# Patient Record
Sex: Female | Born: 1935 | Race: Black or African American | Hispanic: No | Smoking: Never smoker
Health system: Southern US, Community
[De-identification: ages and names within clinical notes are randomized; demographics above are authoritative.]

## PROBLEM LIST (undated history)

## (undated) DIAGNOSIS — K219 Gastro-esophageal reflux disease without esophagitis: Secondary | ICD-10-CM

## (undated) DIAGNOSIS — E039 Hypothyroidism, unspecified: Secondary | ICD-10-CM

## (undated) DIAGNOSIS — E785 Hyperlipidemia, unspecified: Secondary | ICD-10-CM

## (undated) DIAGNOSIS — I10 Essential (primary) hypertension: Secondary | ICD-10-CM

## (undated) HISTORY — PX: ABDOMINAL HYSTERECTOMY: SHX81

## (undated) HISTORY — PX: OTHER SURGICAL HISTORY: SHX169

---

## 2018-02-19 ENCOUNTER — Inpatient Hospital Stay (HOSPITAL_COMMUNITY)
Admission: EM | Admit: 2018-02-19 | Discharge: 2018-02-22 | DRG: 152 | Disposition: A | Discharge status: Home Health | Payer: Medicare Other | Attending: Family Medicine | Admitting: Family Medicine

## 2018-02-19 ENCOUNTER — Other Ambulatory Visit: Payer: Self-pay

## 2018-02-19 ENCOUNTER — Encounter (HOSPITAL_COMMUNITY): Payer: Self-pay | Admitting: Internal Medicine

## 2018-02-19 ENCOUNTER — Observation Stay (HOSPITAL_BASED_OUTPATIENT_CLINIC_OR_DEPARTMENT_OTHER): Payer: Medicare Other

## 2018-02-19 ENCOUNTER — Observation Stay (HOSPITAL_COMMUNITY): Payer: Medicare Other

## 2018-02-19 ENCOUNTER — Emergency Department (HOSPITAL_COMMUNITY): Payer: Medicare Other

## 2018-02-19 DIAGNOSIS — Z79890 Hormone replacement therapy (postmenopausal): Secondary | ICD-10-CM

## 2018-02-19 DIAGNOSIS — K219 Gastro-esophageal reflux disease without esophagitis: Secondary | ICD-10-CM | POA: Diagnosis present

## 2018-02-19 DIAGNOSIS — M170 Bilateral primary osteoarthritis of knee: Secondary | ICD-10-CM | POA: Diagnosis present

## 2018-02-19 DIAGNOSIS — I361 Nonrheumatic tricuspid (valve) insufficiency: Secondary | ICD-10-CM | POA: Diagnosis not present

## 2018-02-19 DIAGNOSIS — Z8249 Family history of ischemic heart disease and other diseases of the circulatory system: Secondary | ICD-10-CM

## 2018-02-19 DIAGNOSIS — Z85038 Personal history of other malignant neoplasm of large intestine: Secondary | ICD-10-CM

## 2018-02-19 DIAGNOSIS — R778 Other specified abnormalities of plasma proteins: Secondary | ICD-10-CM | POA: Diagnosis present

## 2018-02-19 DIAGNOSIS — Z885 Allergy status to narcotic agent status: Secondary | ICD-10-CM | POA: Diagnosis not present

## 2018-02-19 DIAGNOSIS — H9190 Unspecified hearing loss, unspecified ear: Secondary | ICD-10-CM | POA: Diagnosis present

## 2018-02-19 DIAGNOSIS — G5603 Carpal tunnel syndrome, bilateral upper limbs: Secondary | ICD-10-CM | POA: Diagnosis present

## 2018-02-19 DIAGNOSIS — R7989 Other specified abnormal findings of blood chemistry: Secondary | ICD-10-CM | POA: Diagnosis not present

## 2018-02-19 DIAGNOSIS — Z90710 Acquired absence of both cervix and uterus: Secondary | ICD-10-CM | POA: Diagnosis not present

## 2018-02-19 DIAGNOSIS — E785 Hyperlipidemia, unspecified: Secondary | ICD-10-CM | POA: Diagnosis present

## 2018-02-19 DIAGNOSIS — N184 Chronic kidney disease, stage 4 (severe): Secondary | ICD-10-CM | POA: Diagnosis present

## 2018-02-19 DIAGNOSIS — R0602 Shortness of breath: Secondary | ICD-10-CM | POA: Diagnosis present

## 2018-02-19 DIAGNOSIS — I10 Essential (primary) hypertension: Secondary | ICD-10-CM | POA: Diagnosis not present

## 2018-02-19 DIAGNOSIS — Z7951 Long term (current) use of inhaled steroids: Secondary | ICD-10-CM

## 2018-02-19 DIAGNOSIS — I2699 Other pulmonary embolism without acute cor pulmonale: Secondary | ICD-10-CM | POA: Diagnosis present

## 2018-02-19 DIAGNOSIS — E039 Hypothyroidism, unspecified: Secondary | ICD-10-CM | POA: Diagnosis present

## 2018-02-19 DIAGNOSIS — I248 Other forms of acute ischemic heart disease: Secondary | ICD-10-CM | POA: Diagnosis present

## 2018-02-19 DIAGNOSIS — Z79899 Other long term (current) drug therapy: Secondary | ICD-10-CM

## 2018-02-19 DIAGNOSIS — I5031 Acute diastolic (congestive) heart failure: Secondary | ICD-10-CM

## 2018-02-19 DIAGNOSIS — I130 Hypertensive heart and chronic kidney disease with heart failure and stage 1 through stage 4 chronic kidney disease, or unspecified chronic kidney disease: Secondary | ICD-10-CM | POA: Diagnosis present

## 2018-02-19 DIAGNOSIS — J111 Influenza due to unidentified influenza virus with other respiratory manifestations: Secondary | ICD-10-CM | POA: Diagnosis present

## 2018-02-19 DIAGNOSIS — J9601 Acute respiratory failure with hypoxia: Secondary | ICD-10-CM | POA: Diagnosis present

## 2018-02-19 DIAGNOSIS — I2693 Single subsegmental pulmonary embolism without acute cor pulmonale: Secondary | ICD-10-CM | POA: Diagnosis not present

## 2018-02-19 DIAGNOSIS — I509 Heart failure, unspecified: Secondary | ICD-10-CM

## 2018-02-19 DIAGNOSIS — I50811 Acute right heart failure: Secondary | ICD-10-CM | POA: Diagnosis present

## 2018-02-19 DIAGNOSIS — I272 Pulmonary hypertension, unspecified: Secondary | ICD-10-CM | POA: Diagnosis present

## 2018-02-19 DIAGNOSIS — N189 Chronic kidney disease, unspecified: Secondary | ICD-10-CM | POA: Diagnosis present

## 2018-02-19 HISTORY — DX: Essential (primary) hypertension: I10

## 2018-02-19 HISTORY — DX: Gastro-esophageal reflux disease without esophagitis: K21.9

## 2018-02-19 HISTORY — DX: Hypothyroidism, unspecified: E03.9

## 2018-02-19 HISTORY — DX: Hyperlipidemia, unspecified: E78.5

## 2018-02-19 LAB — CBC WITH DIFFERENTIAL/PLATELET
Abs Immature Granulocytes: 0.05 10*3/uL (ref 0.00–0.07)
Basophils Absolute: 0 10*3/uL (ref 0.0–0.1)
Basophils Relative: 0 %
Eosinophils Absolute: 0 10*3/uL (ref 0.0–0.5)
Eosinophils Relative: 0 %
HCT: 38.2 % (ref 36.0–46.0)
HEMOGLOBIN: 11.8 g/dL — AB (ref 12.0–15.0)
Immature Granulocytes: 1 %
Lymphocytes Relative: 9 %
Lymphs Abs: 0.7 10*3/uL (ref 0.7–4.0)
MCH: 28.2 pg (ref 26.0–34.0)
MCHC: 30.9 g/dL (ref 30.0–36.0)
MCV: 91.4 fL (ref 80.0–100.0)
MONOS PCT: 8 %
Monocytes Absolute: 0.6 10*3/uL (ref 0.1–1.0)
Neutro Abs: 6.4 10*3/uL (ref 1.7–7.7)
Neutrophils Relative %: 82 %
Platelets: 203 10*3/uL (ref 150–400)
RBC: 4.18 MIL/uL (ref 3.87–5.11)
RDW: 13.8 % (ref 11.5–15.5)
WBC: 7.8 10*3/uL (ref 4.0–10.5)
nRBC: 0 % (ref 0.0–0.2)

## 2018-02-19 LAB — ECHOCARDIOGRAM COMPLETE
Height: 66 in
Weight: 2816 oz

## 2018-02-19 LAB — PROTIME-INR
INR: 1.03
Prothrombin Time: 13.4 seconds (ref 11.4–15.2)

## 2018-02-19 LAB — LIPID PANEL
CHOL/HDL RATIO: 3.2 ratio
Cholesterol: 119 mg/dL (ref 0–200)
HDL: 37 mg/dL — ABNORMAL LOW (ref >40)
LDL Cholesterol: 59 mg/dL (ref 0–99)
Triglycerides: 113 mg/dL (ref <150)
VLDL: 23 mg/dL (ref 0–40)

## 2018-02-19 LAB — HEMOGLOBIN A1C
Hgb A1c MFr Bld: 5.8 % — ABNORMAL HIGH (ref 4.8–5.6)
Mean Plasma Glucose: 119.76 mg/dL

## 2018-02-19 LAB — COMPREHENSIVE METABOLIC PANEL
ALT: 38 U/L (ref 0–44)
AST: 54 U/L — AB (ref 15–41)
Albumin: 3.9 g/dL (ref 3.5–5.0)
Alkaline Phosphatase: 70 U/L (ref 38–126)
Anion gap: 10 (ref 5–15)
BUN: 56 mg/dL — ABNORMAL HIGH (ref 8–23)
CO2: 19 mmol/L — AB (ref 22–32)
Calcium: 9 mg/dL (ref 8.9–10.3)
Chloride: 109 mmol/L (ref 98–111)
Creatinine, Ser: 1.84 mg/dL — ABNORMAL HIGH (ref 0.44–1.00)
GFR calc Af Amer: 27 mL/min — ABNORMAL LOW (ref >60)
GFR calc non Af Amer: 24 mL/min — ABNORMAL LOW (ref >60)
Glucose, Bld: 140 mg/dL — ABNORMAL HIGH (ref 70–99)
Potassium: 4.5 mmol/L (ref 3.5–5.1)
Sodium: 138 mmol/L (ref 135–145)
Total Bilirubin: 0.8 mg/dL (ref 0.3–1.2)
Total Protein: 7.3 g/dL (ref 6.5–8.1)

## 2018-02-19 LAB — HEPARIN LEVEL (UNFRACTIONATED): Heparin Unfractionated: 0.56 IU/mL (ref 0.30–0.70)

## 2018-02-19 LAB — I-STAT TROPONIN, ED: Troponin i, poc: 0.83 ng/mL (ref 0.00–0.08)

## 2018-02-19 LAB — D-DIMER, QUANTITATIVE: D-Dimer, Quant: 14.49 ug/mL-FEU — ABNORMAL HIGH (ref 0.00–0.50)

## 2018-02-19 LAB — TROPONIN I
Troponin I: 0.75 ng/mL (ref <0.03)
Troponin I: 0.87 ng/mL (ref <0.03)

## 2018-02-19 LAB — APTT: aPTT: 23 seconds — ABNORMAL LOW (ref 24–36)

## 2018-02-19 LAB — BRAIN NATRIURETIC PEPTIDE: B Natriuretic Peptide: 922.3 pg/mL — ABNORMAL HIGH (ref 0.0–100.0)

## 2018-02-19 MED ORDER — IPRATROPIUM-ALBUTEROL 0.5-2.5 (3) MG/3ML IN SOLN
3.0000 mL | Freq: Three times a day (TID) | RESPIRATORY_TRACT | Status: DC
  Administered 2018-02-19 – 2018-02-20 (×4): 3 mL via RESPIRATORY_TRACT
  Filled 2018-02-19 (×4): qty 3

## 2018-02-19 MED ORDER — LEVOTHYROXINE SODIUM 50 MCG PO TABS
50.0000 ug | ORAL_TABLET | Freq: Every day | ORAL | Status: DC
  Administered 2018-02-19 – 2018-02-22 (×4): 50 ug via ORAL
  Filled 2018-02-19 (×4): qty 1

## 2018-02-19 MED ORDER — IPRATROPIUM-ALBUTEROL 0.5-2.5 (3) MG/3ML IN SOLN: 3.0000 mL | Freq: Four times a day (QID) | RESPIRATORY_TRACT | Status: DC

## 2018-02-19 MED ORDER — ALBUTEROL SULFATE (2.5 MG/3ML) 0.083% IN NEBU
5.0000 mg | INHALATION_SOLUTION | Freq: Once | RESPIRATORY_TRACT | Status: AC
  Administered 2018-02-19: 5 mg via RESPIRATORY_TRACT
  Filled 2018-02-19: qty 6

## 2018-02-19 MED ORDER — LOSARTAN POTASSIUM 50 MG PO TABS
100.0000 mg | ORAL_TABLET | Freq: Every day | ORAL | Status: DC
  Administered 2018-02-19 – 2018-02-20 (×2): 100 mg via ORAL
  Filled 2018-02-19 (×2): qty 2

## 2018-02-19 MED ORDER — NITROGLYCERIN 0.4 MG SL SUBL: 0.4000 mg | SUBLINGUAL_TABLET | SUBLINGUAL | Status: DC | PRN

## 2018-02-19 MED ORDER — ASPIRIN 81 MG PO CHEW: 324.0000 mg | CHEWABLE_TABLET | Freq: Once | ORAL | Status: DC

## 2018-02-19 MED ORDER — DM-GUAIFENESIN ER 30-600 MG PO TB12
1.0000 | ORAL_TABLET | Freq: Two times a day (BID) | ORAL | Status: DC | PRN
  Filled 2018-02-19: qty 1

## 2018-02-19 MED ORDER — MORPHINE SULFATE (PF) 2 MG/ML IV SOLN: 0.5000 mg | INTRAVENOUS | Status: DC | PRN

## 2018-02-19 MED ORDER — HEPARIN (PORCINE) 25000 UT/250ML-% IV SOLN
1050.0000 [IU]/h | INTRAVENOUS | Status: DC
  Administered 2018-02-19 – 2018-02-20 (×2): 1050 [IU]/h via INTRAVENOUS
  Filled 2018-02-19 (×2): qty 250

## 2018-02-19 MED ORDER — FAMOTIDINE 20 MG PO TABS
20.0000 mg | ORAL_TABLET | Freq: Every day | ORAL | Status: DC
  Administered 2018-02-19 – 2018-02-21 (×3): 20 mg via ORAL
  Filled 2018-02-19 (×3): qty 1

## 2018-02-19 MED ORDER — HEPARIN BOLUS VIA INFUSION
2000.0000 [IU] | Freq: Once | INTRAVENOUS | Status: AC
  Administered 2018-02-19: 2000 [IU] via INTRAVENOUS
  Filled 2018-02-19: qty 2000

## 2018-02-19 MED ORDER — ALBUTEROL SULFATE (2.5 MG/3ML) 0.083% IN NEBU
5.0000 mg | INHALATION_SOLUTION | RESPIRATORY_TRACT | Status: DC | PRN
  Administered 2018-02-19 – 2018-02-21 (×2): 5 mg via RESPIRATORY_TRACT
  Filled 2018-02-19 (×2): qty 6

## 2018-02-19 MED ORDER — ONDANSETRON HCL 4 MG/2ML IJ SOLN: 4.0000 mg | Freq: Four times a day (QID) | INTRAMUSCULAR | Status: DC | PRN

## 2018-02-19 MED ORDER — MECLIZINE HCL 25 MG PO TABS: 25.0000 mg | ORAL_TABLET | Freq: Every day | ORAL | Status: DC | PRN

## 2018-02-19 MED ORDER — TECHNETIUM TO 99M ALBUMIN AGGREGATED: 4.2000 | Freq: Once | INTRAVENOUS | Status: DC | PRN

## 2018-02-19 MED ORDER — FUROSEMIDE 10 MG/ML IJ SOLN: 40.0000 mg | Freq: Two times a day (BID) | INTRAMUSCULAR | Status: DC

## 2018-02-19 MED ORDER — ASPIRIN EC 81 MG PO TBEC
81.0000 mg | DELAYED_RELEASE_TABLET | Freq: Every day | ORAL | Status: DC
  Administered 2018-02-19 – 2018-02-22 (×4): 81 mg via ORAL
  Filled 2018-02-19 (×4): qty 1

## 2018-02-19 MED ORDER — PRAVASTATIN SODIUM 40 MG PO TABS
80.0000 mg | ORAL_TABLET | Freq: Every day | ORAL | Status: DC
  Administered 2018-02-19 – 2018-02-21 (×3): 80 mg via ORAL
  Filled 2018-02-19 (×2): qty 2

## 2018-02-19 MED ORDER — SODIUM CHLORIDE 0.9% FLUSH: 3.0000 mL | INTRAVENOUS | Status: DC | PRN

## 2018-02-19 MED ORDER — FUROSEMIDE 10 MG/ML IJ SOLN
20.0000 mg | Freq: Once | INTRAMUSCULAR | Status: AC
  Administered 2018-02-19: 20 mg via INTRAVENOUS
  Filled 2018-02-19: qty 4

## 2018-02-19 MED ORDER — ZOLPIDEM TARTRATE 5 MG PO TABS: 5.0000 mg | ORAL_TABLET | Freq: Every evening | ORAL | Status: DC | PRN

## 2018-02-19 MED ORDER — SODIUM CHLORIDE 0.9% FLUSH
3.0000 mL | Freq: Two times a day (BID) | INTRAVENOUS | Status: DC
  Administered 2018-02-19 – 2018-02-21 (×3): 3 mL via INTRAVENOUS

## 2018-02-19 MED ORDER — HYDRALAZINE HCL 20 MG/ML IJ SOLN: 5.0000 mg | INTRAMUSCULAR | Status: DC | PRN

## 2018-02-19 MED ORDER — TECHNETIUM TC 99M DIETHYLENETRIAME-PENTAACETIC ACID: 31.0000 | Freq: Once | INTRAVENOUS | Status: DC | PRN

## 2018-02-19 MED ORDER — ACETAMINOPHEN 325 MG PO TABS
650.0000 mg | ORAL_TABLET | ORAL | Status: DC | PRN
  Administered 2018-02-22: 650 mg via ORAL
  Filled 2018-02-19: qty 2

## 2018-02-19 MED ORDER — HEPARIN SODIUM (PORCINE) 5000 UNIT/ML IJ SOLN: 5000.0000 [IU] | Freq: Three times a day (TID) | INTRAMUSCULAR | Status: DC

## 2018-02-19 MED ORDER — SODIUM CHLORIDE 0.9 % IV SOLN: 250.0000 mL | INTRAVENOUS | Status: DC | PRN

## 2018-02-19 MED ORDER — IPRATROPIUM-ALBUTEROL 0.5-2.5 (3) MG/3ML IN SOLN
3.0000 mL | Freq: Once | RESPIRATORY_TRACT | Status: AC
  Administered 2018-02-19: 3 mL via RESPIRATORY_TRACT
  Filled 2018-02-19: qty 3

## 2018-02-19 NOTE — ED Triage Notes (Signed)
With exertion pt O2 saturation drops into 80's is in 90's at rest. Near syncopal at home with exertion alert to normal on arrival to ED. Recent Hx + flu virus.

## 2018-02-19 NOTE — ED Notes (Signed)
ECG given to Dr. Blaine Hamper for review

## 2018-02-19 NOTE — ED Notes (Signed)
Bed: QG92 Expected date:  Expected time:  Means of arrival:  Comments: 85 yr old flu positive, low O2 sats

## 2018-02-19 NOTE — Progress Notes (Signed)
ANTICOAGULATION CONSULT NOTE - Initial Consult  Pharmacy Consult for IV heparin Indication: R/o PE  Allergies  Allergen Reactions  . Codeine Nausea And Vomiting    Patient Measurements:   Heparin Dosing Weight: 65 kg  Vital Signs: Temp: 98.7 F (37.1 C) (01/24 0048) BP: 115/67 (01/24 0048) Pulse Rate: 75 (01/24 0048)  Labs: Recent Labs    02/19/18 0120 02/19/18 0320  HGB 11.8*  --   HCT 38.2  --   PLT 203  --   CREATININE 1.84*  --   TROPONINI  --  0.75*    CrCl cannot be calculated (Unknown ideal weight.).   Medical History: Past Medical History:  Diagnosis Date  . GERD (gastroesophageal reflux disease)   . HLD (hyperlipidemia)   . HTN (hypertension)   . Hypothyroidism     Medications:  Scheduled:  . aspirin EC  81 mg Oral Daily  . famotidine  20 mg Oral QHS  . furosemide  20 mg Intravenous Once  . ipratropium-albuterol  3 mL Nebulization TID  . levothyroxine  50 mcg Oral QAC breakfast  . losartan  100 mg Oral Daily  . pravastatin  80 mg Oral q1800  . sodium chloride flush  3 mL Intravenous Q12H   Infusions:  . sodium chloride      Assessment: 89 yoF with SOB, found to have elevated d-dimer. IV heparin for r/o PE.  Baseline labs: H/H=11.8/38.2 Plts=203 aPtt = 23 INR=1.03  Goal of Therapy:  Heparin level 0.3-0.7 units/ml Monitor platelets by anticoagulation protocol: Yes   Plan:  Baseline aptt, PT/INR, Ht and Wt STAT Heparin 2000 unit IV bolus x1 Start drip at 1050 units/hr Daily CBC/HL Check 1st HL in 8 hours   Lawana Pai R 02/19/2018,4:51 AM

## 2018-02-19 NOTE — Progress Notes (Signed)
Bilateral lower extremities venous duplex exam completed. Please see preliminary notes on CV PROC under chart review. Lyonel Morejon H Ronon Ferger(RDMS RVT) 02/19/18 11:38 AM

## 2018-02-19 NOTE — ED Notes (Signed)
ED TO INPATIENT HANDOFF REPORT  Name/Age/Gender Morgan Harrell 85 y.o. female  Code Status    Code Status Orders  (From admission, onward)         Start     Ordered   02/19/18 0328  Full code  Continuous     02/19/18 0329        Code Status History    This patient has a current code status but no historical code status.      Home/SNF/Other Home  Chief Complaint Shortness of Breath  Level of Care/Admitting Diagnosis ED Disposition    ED Disposition Condition Comment   Admit  Hospital Area: [facility] [[id number]]  Level of Care: Telemetry [5]  Admit to tele based on following criteria: Other see comments  Comments: CHF  Diagnosis: Acute CHF (congestive heart failure) [facility]) [[id number]]  Admitting Physician: Ivor Costa [[id number]]  Attending Physician: Ivor Costa [[id number]]  PT Class (Do Not Modify): Observation [104]  PT Acc Code (Do Not Modify): Observation [10022]       Medical History Past Medical History:  Diagnosis Date  . GERD (gastroesophageal reflux disease)   . HLD (hyperlipidemia)   . HTN (hypertension)   . Hypothyroidism     Allergies Allergies  Allergen Reactions  . Codeine Nausea And Vomiting    IV Location/Drains/Wounds Patient Lines/Drains/Airways Status   Active Line/Drains/Airways    Name:   Placement date:   Placement time:   Site:   Days:   Peripheral IV 02/19/18 Right Antecubital   02/19/18    1142    Antecubital   less than 1          Labs/Imaging Results for orders placed or performed during the hospital encounter of 02/19/18 (from the past 48 hour(s))  Comprehensive metabolic panel     Status: Abnormal   Collection Time: 02/19/18  1:20 AM  Result Value Ref Range   Sodium 138 135 - 145 mmol/L   Potassium 4.5 3.5 - 5.1 mmol/L   Chloride 109 98 - 111 mmol/L   CO2 19 (L) 22 - 32 mmol/L   Glucose, Bld 140 (H) 70 - 99 mg/dL   BUN 56 (H) 8 - 23 mg/dL   Creatinine, Ser 1.84 (H) 0.44 - 1.00 mg/dL   Calcium 9.0 8.9 -  10.3 mg/dL   Total Protein 7.3 6.5 - 8.1 g/dL   Albumin 3.9 3.5 - 5.0 g/dL   AST 54 (H) 15 - 41 U/L   ALT 38 0 - 44 U/L   Alkaline Phosphatase 70 38 - 126 U/L   Total Bilirubin 0.8 0.3 - 1.2 mg/dL   GFR calc non Af Amer 24 (L) >60 mL/min   GFR calc Af Amer 27 (L) >60 mL/min   Anion gap 10 5 - 15    Comment: Performed at [facility], [place] [address] [place], [place] [address]  CBC with Differential     Status: Abnormal   Collection Time: 02/19/18  1:20 AM  Result Value Ref Range   WBC 7.8 4.0 - 10.5 K/uL   RBC 4.18 3.87 - 5.11 MIL/uL   Hemoglobin 11.8 (L) 12.0 - 15.0 g/dL   HCT 38.2 36.0 - 46.0 %   MCV 91.4 80.0 - 100.0 fL   MCH 28.2 26.0 - 34.0 pg   MCHC 30.9 30.0 - 36.0 g/dL   RDW 13.8 11.5 - 15.5 %   Platelets 203 150 - 400 K/uL   nRBC 0.0 0.0 - 0.2 %  Neutrophils Relative % 82 %   Neutro Abs 6.4 1.7 - 7.7 K/uL   Lymphocytes Relative 9 %   Lymphs Abs 0.7 0.7 - 4.0 K/uL   Monocytes Relative 8 %   Monocytes Absolute 0.6 0.1 - 1.0 K/uL   Eosinophils Relative 0 %   Eosinophils Absolute 0.0 0.0 - 0.5 K/uL   Basophils Relative 0 %   Basophils Absolute 0.0 0.0 - 0.1 K/uL   Immature Granulocytes 1 %   Abs Immature Granulocytes 0.05 0.00 - 0.07 K/uL    Comment: Performed at [facility], [place] [address], [place], [place] [address]  Hemoglobin A1c     Status: Abnormal   Collection Time: 02/19/18  1:20 AM  Result Value Ref Range   Hgb A1c MFr Bld 5.8 (H) 4.8 - 5.6 %    Comment: (NOTE) Pre diabetes:          5.7%-6.4% Diabetes:              >6.4% Glycemic control for   <7.0% adults with diabetes    Mean Plasma Glucose 119.76 mg/dL    Comment: Performed at [facility] [address], [place], [place] [address]  I-Stat Troponin, ED (not at [facility])     Status: Abnormal   Collection Time: 02/19/18  1:38 AM  Result Value Ref Range   Troponin i, poc 0.83 (HH) 0.00 - 0.08 ng/mL   Comment NOTIFIED PHYSICIAN    Comment 3             Comment: Due to the release kinetics of cTnI, a negative result within the first hours of the onset of symptoms does not rule out myocardial infarction with certainty. If myocardial infarction is still suspected, repeat the test at appropriate intervals.   D-dimer, quantitative (not at [facility])     Status: Abnormal   Collection Time: 02/19/18  3:20 AM  Result Value Ref Range   D-Dimer, Quant 14.49 (H) 0.00 - 0.50 ug/mL-FEU    Comment: (NOTE) At the manufacturer cut-off of 0.50 ug/mL FEU, this assay has been documented to exclude PE with a sensitivity and negative predictive value of 97 to 99%.  At this time, this assay has not been approved by the FDA to exclude DVT/VTE. Results should be correlated with clinical presentation. Performed at [facility], [place] [address], [place], [place] [address]   Brain natriuretic peptide     Status: Abnormal   Collection Time: 02/19/18  3:20 AM  Result Value Ref Range   B Natriuretic Peptide 922.3 (H) 0.0 - 100.0 pg/mL    Comment: Performed at [facility], [place] [address], [place], [place] [address]  Lipid panel     Status: Abnormal   Collection Time: 02/19/18  3:20 AM  Result Value Ref Range   Cholesterol 119 0 - 200 mg/dL   Triglycerides 113 <150 mg/dL   HDL 37 (L) >40 mg/dL   Total CHOL/HDL Ratio 3.2 RATIO   VLDL 23 0 - 40 mg/dL   LDL Cholesterol 59 0 - 99 mg/dL    Comment:        Total Cholesterol/HDL:CHD Risk Coronary Heart Disease Risk Table                     Men   Women  1/2 Average Risk   3.4   3.3  Average Risk       5.0   4.4  2 X Average Risk  9.6   7.1  3 X Average Risk  23.4   11.0        Use the calculated Patient Ratio above and the CHD Risk Table to determine the patient's CHD Risk.        ATP III CLASSIFICATION (LDL):  <100     mg/dL   Optimal  100-129  mg/dL   Near or Above                    Optimal  130-159  mg/dL   Borderline  160-189  mg/dL   High  >190     mg/dL   Very  High Performed at [facility] [address], [place], [place] [address]   Troponin I -     Status: Abnormal   Collection Time: 02/19/18  3:20 AM  Result Value Ref Range   Troponin I 0.75 (HH) <0.03 ng/mL    Comment: CRITICAL RESULT CALLED TO, READ BACK BY AND VERIFIED WITH: J OXENDINE,RN 02/19/18 0424 RHOLMES Performed at [facility], [place] [address], [place], [place] [address]   APTT     Status: Abnormal   Collection Time: 02/19/18  5:53 AM  Result Value Ref Range   aPTT 23 (L) 24 - 36 seconds    Comment: Performed at [facility], [place] [address] [place], [place] [address]  Protime-INR     Status: None   Collection Time: 02/19/18  5:53 AM  Result Value Ref Range   Prothrombin Time 13.4 11.4 - 15.2 seconds   INR 1.03     Comment: Performed at [facility], [place] [address] [place], [place] [address]   Dg Chest 2 View  Result Date: 02/19/2018 CLINICAL DATA:  85 year old female with shortness of breath. EXAM: CHEST - 2 VIEW COMPARISON:  None. FINDINGS: There is cardiomegaly with probable mild vascular congestion. No edema. No focal consolidation or pneumothorax. There is slight flattening of the diaphragms which may represent areas of scarring. Trace pleural effusions are less likely but not excluded. Clinical correlation is recommended. There is atherosclerotic calcification of the aortic arch. No acute osseous pathology. IMPRESSION: Cardiomegaly with mild vascular congestion. No edema or focal consolidation. Electronically Signed   By: Anner Crete M.D.   On: 02/19/2018 01:57   Nm Pulmonary Perf And Vent  Addendum Date: 02/19/2018   ADDENDUM REPORT: 02/19/2018 10:44 ADDENDUM: These results were called by telephone at the time of interpretation on 02/19/2018 at 10:44 am to Dr. Kyung Bacca, covering hospitalist , who verbally acknowledged these results. Electronically Signed   By: Lowella Grip III M.D.   On:  02/19/2018 10:44   Result Date: 02/19/2018 CLINICAL DATA:  Shortness of Breath EXAM: NUCLEAR MEDICINE VENTILATION - PERFUSION LUNG SCAN VIEWS: Anterior, posterior, left lateral, right lateral, RPO, LPO, RAO, LAO-ventilation and perfusion RADIOPHARMACEUTICALS:  31.0 mCi of Tc-24m DTPA aerosol inhalation and 4.2 mCi Tc38m MAA IV COMPARISON:  Chest radiograph February 19, 2018 FINDINGS: Ventilation: Radiotracer uptake is homogeneous and symmetric bilaterally. No appreciable ventilation defects. Perfusion: There is absence of perfusion in the apical segment of the left upper lobe. There is also photopenia in the superior segment of the left lower lobe. There is also a perfusion defect in the anterior segment of the right upper lobe. IMPRESSION: There are apparent segmental perfusion defects that associated ventilation lesions. This study is felt to constitute a high probability of pulmonary embolus based on PIOPED II criteria. Electronically Signed: By: Gwyndolyn Saxon  Jasmine December III M.D. On: 02/19/2018 10:30   Vas [place] Lower Extremity Venous (dvt)  Result Date: 02/19/2018  Lower Venous Study Indications: Positive D-dimer.  Limitations: Imagery deficit; patient discomfort. Comparison Study: No comparison study available. Performing Technologist: Rudell Cobb  Examination Guidelines: A complete evaluation includes B-mode imaging, spectral Doppler, color Doppler, and power Doppler as needed of all accessible portions of each vessel. Bilateral testing is considered an integral part of a complete examination. Limited examinations for reoccurring indications may be performed as noted.  Right Venous Findings: +---------+---------------+---------+-----------+----------+-------------------+          CompressibilityPhasicitySpontaneityPropertiesSummary             +---------+---------------+---------+-----------+----------+-------------------+ CFV      Full           Yes      Yes                                       +---------+---------------+---------+-----------+----------+-------------------+ SFJ      Full                                                             +---------+---------------+---------+-----------+----------+-------------------+ FV Prox  Full                                                             +---------+---------------+---------+-----------+----------+-------------------+ FV Mid   Full                                                             +---------+---------------+---------+-----------+----------+-------------------+ FV Distal               Yes      Yes                  difficult to                                                              perform compression                                                       due to patient has                                                        severe pain         +---------+---------------+---------+-----------+----------+-------------------+  PFV      Full                                                             +---------+---------------+---------+-----------+----------+-------------------+ POP      Full           Yes      Yes                                      +---------+---------------+---------+-----------+----------+-------------------+ PTV      Full                                                             +---------+---------------+---------+-----------+----------+-------------------+ PERO                                                  Not visualized      +---------+---------------+---------+-----------+----------+-------------------+  Left Venous Findings: +---------+---------------+---------+-----------+----------+--------------+          CompressibilityPhasicitySpontaneityPropertiesSummary        +---------+---------------+---------+-----------+----------+--------------+ CFV      Full           Yes      Yes                                  +---------+---------------+---------+-----------+----------+--------------+ SFJ      Full                                                        +---------+---------------+---------+-----------+----------+--------------+ FV Prox  Full                                                        +---------+---------------+---------+-----------+----------+--------------+ FV Mid   Full                                                        +---------+---------------+---------+-----------+----------+--------------+ FV DistalFull                                                        +---------+---------------+---------+-----------+----------+--------------+ PFV      Full                                                        +---------+---------------+---------+-----------+----------+--------------+  POP      Full           Yes      Yes                                 +---------+---------------+---------+-----------+----------+--------------+ PTV      Full                                                        +---------+---------------+---------+-----------+----------+--------------+ PERO                                                  Not visualized +---------+---------------+---------+-----------+----------+--------------+    Summary: Right: There is no evidence of deep vein thrombosis in the lower extremity. However, portions of this examination were limited- see technologist comments above. No cystic structure found in the popliteal fossa. Left: There is no evidence of deep vein thrombosis in the lower extremity. However, portions of this examination were limited- see technologist comments above. No cystic structure found in the popliteal fossa.  *See table(s) above for measurements and observations. Electronically signed by Servando Snare MD on 02/19/2018 at 12:04:40 PM.    Final     Pending Labs Unresulted Labs (From admission, onward)    Start     Ordered    02/20/18 0932  Basic metabolic panel  Daily,   R     02/19/18 0329   02/20/18 0500  CBC  Daily,   R     02/19/18 0647   02/19/18 1630  Heparin level (unfractionated)  Once-Timed,   R     02/19/18 0910   02/19/18 0320  Troponin I - Now Then Q6H  Now then every 6 hours,   R     02/19/18 0319          Vitals/Pain Today's Vitals   02/19/18 1400 02/19/18 1430 02/19/18 1500 02/19/18 1530  BP: (!) 145/63 (!) 142/74 (!) 141/64 (!) 141/65  Pulse: 70 72 73 71  Resp: 16 14 16 17   Temp:      SpO2: 100% 100% 100% 100%  Weight:      Height:        Isolation Precautions No active isolations  Medications Medications  albuterol (PROVENTIL) (2.5 MG/3ML) 0.083% nebulizer solution 5 mg (has no administration in time range)  dextromethorphan-guaiFENesin (MUCINEX DM) 30-600 MG per 12 hr tablet 1 tablet (has no administration in time range)  losartan (COZAAR) tablet 100 mg (100 mg Oral Given 02/19/18 1017)  pravastatin (PRAVACHOL) tablet 80 mg (has no administration in time range)  levothyroxine (SYNTHROID, LEVOTHROID) tablet 50 mcg (50 mcg Oral Given 02/19/18 0817)  meclizine (ANTIVERT) tablet 25 mg (has no administration in time range)  famotidine (PEPCID) tablet 20 mg (has no administration in time range)  nitroGLYCERIN (NITROSTAT) SL tablet 0.4 mg (has no administration in time range)  morphine 2 MG/ML injection 0.5 mg (has no administration in time range)  aspirin EC tablet 81 mg (81 mg Oral Given 02/19/18 0529)  ipratropium-albuterol (DUONEB) 0.5-2.5 (3) MG/3ML nebulizer solution 3 mL (3 mLs Nebulization Given 02/19/18 1338)  sodium chloride flush (NS) 0.9 %  injection 3 mL (0 mLs Intravenous Hold 02/19/18 0904)  sodium chloride flush (NS) 0.9 % injection 3 mL (has no administration in time range)  0.9 %  sodium chloride infusion (has no administration in time range)  acetaminophen (TYLENOL) tablet 650 mg (has no administration in time range)  ondansetron (ZOFRAN) injection 4 mg (has no  administration in time range)  zolpidem (AMBIEN) tablet 5 mg (has no administration in time range)  hydrALAZINE (APRESOLINE) injection 5 mg (has no administration in time range)  heparin ADULT infusion 100 units/mL (25000 units/243mL sodium chloride 0.45%) (1,050 Units/hr Intravenous Rate/Dose Verify 02/19/18 1521)  technetium albumin aggregated (MAA) injection solution 4.2 millicurie (has no administration in time range)  technetium TC 52M diethylenetriame-pentaacetic acid (DTPA) injection 31 millicurie (has no administration in time range)  albuterol (PROVENTIL) (2.5 MG/3ML) 0.083% nebulizer solution 5 mg (5 mg Nebulization Given 02/19/18 0123)  ipratropium-albuterol (DUONEB) 0.5-2.5 (3) MG/3ML nebulizer solution 3 mL (3 mLs Nebulization Given 02/19/18 0241)  furosemide (LASIX) injection 20 mg (20 mg Intravenous Given 02/19/18 0528)  heparin bolus via infusion 2,000 Units (2,000 Units Intravenous Bolus from Bag 02/19/18 0822)    Mobility walks with device

## 2018-02-19 NOTE — Progress Notes (Signed)
ANTICOAGULATION CONSULT NOTE -   Pharmacy Consult for IV heparin Indication: R/o PE  Allergies  Allergen Reactions  . Codeine Nausea And Vomiting    Patient Measurements: Height: 5\' 6"  (167.6 cm) Weight: 168 lb 14.4 oz (76.6 kg) IBW/kg (Calculated) : 59.3 Heparin Dosing Weight: 65 kg  Vital Signs: Temp: 98 F (36.7 C) (01/24 1629) Temp Source: Oral (01/24 1629) BP: 173/78 (01/24 1629) Pulse Rate: 69 (01/24 1629)  Labs: Recent Labs    02/19/18 0120 02/19/18 0320 02/19/18 0553 02/19/18 1725  HGB 11.8*  --   --   --   HCT 38.2  --   --   --   PLT 203  --   --   --   APTT  --   --  23*  --   LABPROT  --   --  13.4  --   INR  --   --  1.03  --   HEPARINUNFRC  --   --   --  0.56  CREATININE 1.84*  --   --   --   TROPONINI  --  0.75*  --   --     Estimated Creatinine Clearance: 20.8 mL/min (A) (by C-G formula based on SCr of 1.84 mg/dL (H)).   Medical History: Past Medical History:  Diagnosis Date  . GERD (gastroesophageal reflux disease)   . HLD (hyperlipidemia)   . HTN (hypertension)   . Hypothyroidism     Medications:  Scheduled:  . aspirin EC  81 mg Oral Daily  . famotidine  20 mg Oral QHS  . ipratropium-albuterol  3 mL Nebulization TID  . levothyroxine  50 mcg Oral QAC breakfast  . losartan  100 mg Oral Daily  . pravastatin  80 mg Oral q1800  . sodium chloride flush  3 mL Intravenous Q12H   Infusions:  . sodium chloride    . heparin 1,050 Units/hr (02/19/18 1521)    Assessment: 34 yoF with SOB, found to have elevated d-dimer. IV heparin for r/o PE.  Baseline labs: H/H=11.8/38.2 Plts=203 aPtt = 23 INR=1.03  02/19/2018 1st Heparin level=0.56, therapeutic No bleeding noted per RN  Goal of Therapy:  Heparin level 0.3-0.7 units/ml Monitor platelets by anticoagulation protocol: Yes   Plan:  Continue heparin drip at 1050 units/hr Daily CBC/HL Check  HL in 8 hours   Dolly Rias RPh 02/19/2018, 6:25 PM Pager [phone]

## 2018-02-19 NOTE — Consult Note (Addendum)
Cardiology Consultation:   Patient ID: Morgan Harrell MRN: [id number]; DOB: February 24, 1924  Admit date: 02/19/2018 Date of Consult: 02/19/2018  Primary Care Provider: System, Pcp Not In Primary Cardiologist: Mertie Moores, MD  - New Primary Electrophysiologist:  None    Patient Profile:   Morgan Harrell is a 84 y.o. female with a hx of CKD, hypertension, hyperlipidemia, remote colon cancer 1996, arthritis, bilateral carpal tunnel, GERD and osteoarthritis of the knees who is being seen today for the evaluation of shortness of breath and elevated troponin at the request of Dr. Kyung Bacca.  History of Present Illness:   Morgan Harrell is a 85 year old female who lives at home and is independent of most ADLs.  She recently moved near from [place] to live with her daughter.  She has had frequent car trips between [place] and [place].  She presented to the [facility] long ED with complaints of shortness of breath for about the last 2 weeks with worsening over the past several days.  The patient has a cough with clear mucus production and she had subjective fever and chills a few days ago.  She was seen in urgent care with a positive flu test.  She was given a prescription for Xofluza.   In the ED the patient was noted to have bilateral lower leg edema, no calf pain.  Oxygen saturations were in the 80s on room air.  The patient denied a history of CHF but is taking Lasix at home.  BNP is 922.3.  Troponin 0.83 (POC), 0.75.  D-dimer is 14.49 and VQ scan is pending.  Serum creatinine is 1.84.  Upon my examination the patient is a pleasant, conversant lady who is hard of hearing.  She lives in [place] however since her husband's death in 2023-01-02 she has been coming up to [place] to stay with her daughter intermittently.  She says that she has had a few months of worsening shortness of breath, significantly worse over the last 2 weeks until she was having dyspnea on minimal exertion.  She was  previously seen in [place] by her PCP and apparently had a lung test and was told that she has some type of pulmonary issue for which she was prescribed Symbicort.  She denies any chest pain, pressure, tightness.  She does feel exhausted with activity.  She has had no orthopnea, PND or swelling.  She has bilateral knee arthritis and uses a walker.  She has bilateral carpal tunnel syndrome and has significant pain in both of her hands.  2 days ago she went to [facility] urgent care for her symptoms and reportedly tested positive for the flu.  She was also given an albuterol inhaler.  She has never smoked.  She denies any prior cardiac history.  She takes Lasix reportedly for blood pressure control.  She denies any prior edema issues.   Past Medical History:  Diagnosis Date  . GERD (gastroesophageal reflux disease)   . HLD (hyperlipidemia)   . HTN (hypertension)   . Hypothyroidism     Past Surgical History:  Procedure Laterality Date  . ABDOMINAL HYSTERECTOMY    . Colon cancer     1996     Home Medications:  Prior to Admission medications   Medication Sig Start Date End Date Taking? Authorizing Provider  albuterol (PROVENTIL HFA;VENTOLIN HFA) 108 (90 Base) MCG/ACT inhaler Inhale 2 puffs into the lungs every 4 (four) hours as needed for wheezing or shortness of breath.   Yes [provider]  budesonide-formoterol (SYMBICORT) 160-4.5 MCG/ACT inhaler Inhale 2 puffs into the lungs 2 (two) times daily.   Yes [provider]  fluticasone (FLONASE) 50 MCG/ACT nasal spray Place 2 sprays into both nostrils daily.   Yes [provider]  furosemide (LASIX) 40 MG tablet Take 40 mg by mouth daily.   Yes [provider]  levothyroxine (SYNTHROID, LEVOTHROID) 50 MCG tablet Take 50 mcg by mouth daily before breakfast.   Yes [provider]  loratadine (CLARITIN) 10 MG tablet Take 10 mg by mouth daily.   Yes [provider]  losartan (COZAAR) 100 MG  tablet Take 100 mg by mouth daily.   Yes [provider]  meclizine (ANTIVERT) 25 MG tablet Take 25 mg by mouth daily as needed for dizziness (vertigo).   Yes [provider]  Pitavastatin Calcium (LIVALO) 4 MG TABS Take 4 mg by mouth daily.   Yes [provider]  ranitidine (ZANTAC) 150 MG tablet Take 150 mg by mouth daily.   Yes [provider]    Inpatient Medications: Scheduled Meds: . aspirin EC  81 mg Oral Daily  . famotidine  20 mg Oral QHS  . ipratropium-albuterol  3 mL Nebulization TID  . levothyroxine  50 mcg Oral QAC breakfast  . losartan  100 mg Oral Daily  . pravastatin  80 mg Oral q1800  . sodium chloride flush  3 mL Intravenous Q12H   Continuous Infusions: . sodium chloride    . heparin 1,050 Units/hr (02/19/18 0820)   PRN Meds: sodium chloride, acetaminophen, albuterol, dextromethorphan-guaiFENesin, hydrALAZINE, meclizine, morphine injection, nitroGLYCERIN, ondansetron (ZOFRAN) IV, sodium chloride flush, technetium albumin aggregated, technetium TC 41M diethylenetriame-pentaacetic acid, zolpidem  Allergies:    Allergies  Allergen Reactions  . Codeine Nausea And Vomiting    Social History:   Social History   Socioeconomic History  . Marital status: Widowed    Spouse name: Not on file  . Number of children: Not on file  . Years of education: Not on file  . Highest education level: Not on file  Occupational History  . Not on file  Social Needs  . Financial resource strain: Not on file  . Food insecurity:    Worry: Not on file    Inability: Not on file  . Transportation needs:    Medical: Not on file    Non-medical: Not on file  Tobacco Use  . Smoking status: Never Smoker  . Smokeless tobacco: Never Used  Substance and Sexual Activity  . Alcohol use: Not Currently    Frequency: Never  . Drug use: Never  . Sexual activity: Not on file  Lifestyle  . Physical activity:    Days per week: Not on file    Minutes per  session: Not on file  . Stress: Not on file  Relationships  . Social connections:    Talks on phone: Not on file    Gets together: Not on file    Attends religious service: Not on file    Active member of club or organization: Not on file    Attends meetings of clubs or organizations: Not on file    Relationship status: Not on file  . Intimate partner violence:    Fear of current or ex partner: Not on file    Emotionally abused: Not on file    Physically abused: Not on file    Forced sexual activity: Not on file  Other Topics Concern  . Not on file  Social History Narrative  . Not on file    Family History:    Family History  Problem Relation Age of Onset  . Hypertension Sister   . CAD Sister        ?MI in her 48's  . Hypertension Brother   . Heart disease Brother        possibly CHF  . Hypertension Mother   . Stroke Mother   . Alzheimer's disease Mother   . Hypertension Father      ROS:  Please see the history of present illness.   All other ROS reviewed and negative.     Physical Exam/Data:   Vitals:   02/19/18 0800 02/19/18 1017 02/19/18 1021 02/19/18 1100  BP: (!) 141/81 (!) 142/67 (!) 142/67 131/79  Pulse: 68  78 70  Resp: 16  14 16   Temp:      SpO2: 99%  100% 100%  Weight:      Height:       No intake or output data in the 24 hours ending 02/19/18 1122 Last 3 Weights 02/19/2018  Weight (lbs) 176 lb  Weight (kg) 79.833 kg     Body mass index is 28.41 kg/m.  General:  Well nourished, well developed, in no acute distress HEENT: normal Lymph: no adenopathy Neck: trace JVD Endocrine:  No thryomegaly Vascular: No carotid bruits; pedal pulses 2+ bilaterally  Cardiac:  normal S1, S2; RRR; no murmur  Lungs: Short inspiratory and expiratory wheezes Abd: soft, nontender, no hepatomegaly  Ext: no edema Musculoskeletal:  No deformities, BUE and BLE strength normal and equal Skin: warm and dry  Neuro:  CNs 2-12 intact, no focal abnormalities  noted Psych:  Normal affect   EKG:  The EKG was personally reviewed and demonstrates:  Sinus rhythm, Multiple premature complexes, vent & supraven, Borderline short PR interval, Incomplete RBBB, Fuhs T wave inversions, no prior for comparison Telemetry:  Telemetry was personally reviewed and demonstrates: Sinus rhythm with PACs in the 70s  Relevant CV Studies:  Echo pending  Laboratory Data:  Chemistry Recent Labs  Lab 02/19/18 0120  NA 138  K 4.5  CL 109  CO2 19*  GLUCOSE 140*  BUN 56*  CREATININE 1.84*  CALCIUM 9.0  GFRNONAA 24*  GFRAA 27*  ANIONGAP 10    Recent Labs  Lab 02/19/18 0120  PROT 7.3  ALBUMIN 3.9  AST 54*  ALT 38  ALKPHOS 70  BILITOT 0.8   Hematology Recent Labs  Lab 02/19/18 0120  WBC 7.8  RBC 4.18  HGB 11.8*  HCT 38.2  MCV 91.4  MCH 28.2  MCHC 30.9  RDW 13.8  PLT 203   Cardiac Enzymes Recent Labs  Lab 02/19/18 0320  TROPONINI 0.75*    Recent Labs  Lab 02/19/18 0138  TROPIPOC 0.83*    BNP Recent Labs  Lab 02/19/18 0320  BNP 922.3*    DDimer  Recent Labs  Lab 02/19/18 0320  DDIMER 14.49*    Radiology/Studies:  Dg Chest 2 View  Result Date: 02/19/2018 CLINICAL DATA:  85 year old female with shortness of breath. EXAM: CHEST - 2 VIEW COMPARISON:  None. FINDINGS: There is cardiomegaly with probable mild vascular congestion. No edema. No focal consolidation or pneumothorax. There is slight flattening of the diaphragms which may represent areas of scarring. Trace pleural effusions are less likely but not excluded. Clinical correlation is recommended. There is atherosclerotic calcification of the aortic arch. No acute osseous pathology. IMPRESSION: Cardiomegaly with mild vascular congestion. No edema  or focal consolidation. Electronically Signed   By: Anner Crete M.D.   On: 02/19/2018 01:57   Nm Pulmonary Perf And Vent  Addendum Date: 02/19/2018   ADDENDUM REPORT: 02/19/2018 10:44 ADDENDUM: These results were called by  telephone at the time of interpretation on 02/19/2018 at 10:44 am to Dr. Kyung Bacca, covering hospitalist , who verbally acknowledged these results. Electronically Signed   By: Lowella Grip III M.D.   On: 02/19/2018 10:44   Result Date: 02/19/2018 CLINICAL DATA:  Shortness of Breath EXAM: NUCLEAR MEDICINE VENTILATION - PERFUSION LUNG SCAN VIEWS: Anterior, posterior, left lateral, right lateral, RPO, LPO, RAO, LAO-ventilation and perfusion RADIOPHARMACEUTICALS:  31.0 mCi of Tc-83m DTPA aerosol inhalation and 4.2 mCi Tc61m MAA IV COMPARISON:  Chest radiograph February 19, 2018 FINDINGS: Ventilation: Radiotracer uptake is homogeneous and symmetric bilaterally. No appreciable ventilation defects. Perfusion: There is absence of perfusion in the apical segment of the left upper lobe. There is also photopenia in the superior segment of the left lower lobe. There is also a perfusion defect in the anterior segment of the right upper lobe. IMPRESSION: There are apparent segmental perfusion defects that associated ventilation lesions. This study is felt to constitute a high probability of pulmonary embolus based on PIOPED II criteria. Electronically Signed: By: Lowella Grip III M.D. On: 02/19/2018 10:30    Assessment and Plan:   1. Shortness of breath/congestive heart failure -Patient with several months of worsening shortness of breath, over the last 2 weeks DOE with minimal exertion.  No chest pain pressure or discomfort.  Was diagnosed in [place] with a pulmonary issue and started on Pulmicort.  She has never smoked. -Patient recently treated for positive flu, per urgent care on Wednesday.  He has wheezes in her lungs. -Chest x-ray shows cardiomegaly with mild vascular congestion, no edema or focal consolidation. -D-dimer is elevated at 14.49.  VQ scan has been ordered, awaiting results. Lower extremity Dopplers are pending. -BNP is 922.3.   -Echo is pending -She has been given Lasix 20 mg IV x1 this  morning.  -Strict intake and output and daily weights. -Monitor response to diuresis.  Follow-up on studies to rule out pulmonary embolism.  Has heparin drip infusing.  2.  Elevated troponin -Troponin 0 0.83 (POC), 0.75.  -Continue to trend troponin, and elevation would indicate myocardial ischemia.  Continued flat pattern would be consistent with demand ischemia in the setting of recent illness and CHF. -Evaluate echocardiogram for wall motion abnormalities and LV function  3.  Acute respiratory failure with hypoxia -Recently treated for positive flu.  Patient has inspiratory and expiratory wheezes.  -Continuing treatment with nebulizers and supportive care.  4.  Hypertension -Patient is on losartan 100 mg daily, lasix 40 mg daily. -Blood pressures have been normal to mildly elevated.  Continue to monitor.  5.  CKD -Patient with history of CKD, but baseline creatinine is not available. -Serum creatinine is 1.84  6.  Hyperlipidemia -On pitavastatin.  LDL is 59.  Continue current therapy   For questions or updates, please contact [place] Please consult [email or web address] for contact info under     Signed, Daune Perch, NP  02/19/2018 11:22 AM   Attending Note:   The patient was seen and examined.  Agree with assessment and plan as noted above.  Changes made to the above note as needed.  Patient seen and independently examined with Pecolia Ades, NP .   We discussed all aspects of the encounter. I agree with the assessment  and plan as stated above.  1.   Dyspnea:    Ms. Lybrand has several reasons to be short of breath.  She was found to have an acute pulmonary embolus.   This Pulmonary  Embolus is likely the cause of her troponin elevation as well as her EKG abnormalities. Echocardiogram has been ordered and is still yet to be done. At this point it is unclear if she has any real cardiac issues.  2.  Pulmonary embolus: Is 85 years old.  VQ scan is high prob of a left  upper lobe pulmonary embolus .   Been traveling but only short car trips. She  denies any leg injury. D-dimer is 14  She has a creatinine of 1.84 and is 85 yo. Would recommend a pharmacy consult to help dose her DOAC.  Echo is pending   3.   HTN:   Continue meds  4.  Elevated troponin:   Likely due to her PE.   I would not suggest further ischemic work up in this 85 yo patient.    I have spent a total of 40 minutes with patient reviewing hospital  notes , telemetry, EKGs, labs and examining patient as well as establishing an assessment and plan that was discussed with the patient. > 50% of time was spent in direct patient care.    Thayer Headings, Brooke Bonito., MD, [facility] 02/19/2018, 12:40 PM [address],  [place] Pager [phone]

## 2018-02-19 NOTE — H&P (Signed)
History and Physical    Violette Morneault KYH:[id number] DOB: 1924-02-08 DOA: 02/19/2018  Referring MD/NP/PA:   PCP: System, Pcp Not In   Patient coming from:  The patient is coming from home.  At baseline, pt is independent for most of ADL.        Chief Complaint: Shortness of breath  HPI: Morgan Harrell is a 85 y.o. female with medical history significant of CKD, hypertension, hyperlipidemia, remote colon cancer 1996 (surgery), knee arthritis, GERD, who presents with shortness of breath.  Patient states that she has been having shortness of breath for about 2 weeks, which has worsened in the past several days.  She has cough with clear mucus production.  Patient had subjective fever and chills few days ago.  She was seen in urgent care yesterday, and had positive flu test (not sure which type of flu).  Patient was given prescription of 2 pills of Xolfuza.  Patient denies any chest pain, fever or chills currently.  She has nausea, but no vomiting, diarrhea or abdominal pain.  No symptoms of UTI or unilateral weakness.  Patient states that she recently moved from [place] to live with her daughter here. She has had frequent traveling by car between [place] and [place]. No calf pain. She has bilateral lower leg edema.  Patient had oxygen desaturation to 80s on room air.  Patient denies history of CHF, but that she is taking Lasix at home. Pt states that she did not have any fall or head injury. No rectal bleeding or dark stool.  ED Course: pt was found to have WBC 7.8, troponin 0 0.83, creatinine 1.84, BUN 56, temperature normal, no tachycardia, no tachypnea.  Chest x-ray showed cardiomegaly and vascular congestion.  Pending BNP. D-dimer 14.4.  Patient is placed on telemetry bed for observation.  Review of Systems:   General: no fevers, chills, no body weight gain, has fatigue HEENT: no blurry vision, hearing changes or sore throat Respiratory: Has dyspnea, coughing, no wheezing CV: no  chest pain, no palpitations GI: Has nausea, no vomiting, abdominal pain, diarrhea, constipation GU: no dysuria, burning on urination, increased urinary frequency, hematuria  Ext: Has leg edema Neuro: no unilateral weakness, numbness, or tingling, no vision change or hearing loss Skin: no rash, no skin tear. MSK: No muscle spasm, no deformity, no limitation of range of movement in spin Heme: No easy bruising.  Travel history: Has frequent recent long distant travel.  Allergy:  Allergies  Allergen Reactions  . Codeine Nausea And Vomiting    Past Medical History:  Diagnosis Date  . GERD (gastroesophageal reflux disease)   . HLD (hyperlipidemia)   . HTN (hypertension)   . Hypothyroidism     Past Surgical History:  Procedure Laterality Date  . ABDOMINAL HYSTERECTOMY    . Colon cancer     1996    Social History:  reports that she has never smoked. She has never used smokeless tobacco. She reports previous alcohol use. She reports that she does not use drugs.  Family History:  Family History  Problem Relation Age of Onset  . Hypertension Sister   . Hypertension Brother      Prior to Admission medications   Medication Sig Start Date End Date Taking? Authorizing Provider  albuterol (PROVENTIL HFA;VENTOLIN HFA) 108 (90 Base) MCG/ACT inhaler Inhale 2 puffs into the lungs every 4 (four) hours as needed for wheezing or shortness of breath.   Yes [provider]  budesonide-formoterol (SYMBICORT) 160-4.5 MCG/ACT inhaler Inhale  2 puffs into the lungs 2 (two) times daily.   Yes [provider]  fluticasone (FLONASE) 50 MCG/ACT nasal spray Place 2 sprays into both nostrils daily.   Yes [provider]  furosemide (LASIX) 40 MG tablet Take 40 mg by mouth daily.   Yes [provider]  levothyroxine (SYNTHROID, LEVOTHROID) 50 MCG tablet Take 50 mcg by mouth daily before breakfast.   Yes [provider]  loratadine (CLARITIN) 10 MG tablet Take 10  mg by mouth daily.   Yes [provider]  losartan (COZAAR) 100 MG tablet Take 100 mg by mouth daily.   Yes [provider]  meclizine (ANTIVERT) 25 MG tablet Take 25 mg by mouth daily as needed for dizziness (vertigo).   Yes [provider]  Pitavastatin Calcium (LIVALO) 4 MG TABS Take 4 mg by mouth daily.   Yes [provider]  ranitidine (ZANTAC) 150 MG tablet Take 150 mg by mouth daily.   Yes [provider]    Physical Exam: Vitals:   02/19/18 0048  BP: 115/67  Pulse: 75  Resp: 16  Temp: 98.7 F (37.1 C)  SpO2: 100%   General: Not in acute distress HEENT:       Eyes: PERRL, EOMI, no scleral icterus.       ENT: No discharge from the ears and nose, no pharynx injection, no tonsillar enlargement.        Neck: Positive JVD, no bruit, no mass felt. Heme: No neck lymph node enlargement. Cardiac: S1/S2, RRR, No murmurs, No gallops or rubs. Respiratory: No rales, wheezing, rhonchi or rubs. GI: Soft, nondistended, nontender, no rebound pain, no organomegaly, BS present. GU: No hematuria Ext: 1+ pitting leg edema bilaterally. 2+DP/PT pulse bilaterally. Musculoskeletal: No joint deformities, No joint redness or warmth, no limitation of ROM in spin. Skin: No rashes.  Neuro: Alert, oriented X3, cranial nerves II-XII grossly intact, moves all extremities normally. Psych: Patient is not psychotic, no suicidal or hemocidal ideation.  Labs on Admission: I have personally reviewed following labs and imaging studies  CBC: Recent Labs  Lab 02/19/18 0120  WBC 7.8  NEUTROABS 6.4  HGB 11.8*  HCT 38.2  MCV 91.4  PLT 419   Basic Metabolic Panel: Recent Labs  Lab 02/19/18 0120  NA 138  K 4.5  CL 109  CO2 19*  GLUCOSE 140*  BUN 56*  CREATININE 1.84*  CALCIUM 9.0   GFR: CrCl cannot be calculated (Unknown ideal weight.). Liver Function Tests: Recent Labs  Lab 02/19/18 0120  AST 54*  ALT 38  ALKPHOS 70  BILITOT 0.8  PROT 7.3    ALBUMIN 3.9   No results for input(s): LIPASE, AMYLASE in the last 168 hours. No results for input(s): AMMONIA in the last 168 hours. Coagulation Profile: No results for input(s): INR, PROTIME in the last 168 hours. Cardiac Enzymes: No results for input(s): CKTOTAL, CKMB, CKMBINDEX, TROPONINI in the last 168 hours. BNP (last 3 results) No results for input(s): PROBNP in the last 8760 hours. HbA1C: No results for input(s): HGBA1C in the last 72 hours. CBG: No results for input(s): GLUCAP in the last 168 hours. Lipid Profile: No results for input(s): CHOL, HDL, LDLCALC, TRIG, CHOLHDL, LDLDIRECT in the last 72 hours. Thyroid Function Tests: No results for input(s): TSH, T4TOTAL, FREET4, T3FREE, THYROIDAB in the last 72 hours. Anemia Panel: No results for input(s): VITAMINB12, FOLATE, FERRITIN, TIBC, IRON, RETICCTPCT in the last 72 hours. Urine analysis: No results found for: COLORURINE, APPEARANCEUR,  LABSPEC, PHURINE, GLUCOSEU, HGBUR, BILIRUBINUR, KETONESUR, PROTEINUR, UROBILINOGEN, NITRITE, LEUKOCYTESUR Sepsis Labs: @LABRCNTIP (procalcitonin:4,lacticidven:4) )No results found for this or any previous visit (from the past 240 hour(s)).   Radiological Exams on Admission: Dg Chest 2 View  Result Date: 02/19/2018 CLINICAL DATA:  85 year old female with shortness of breath. EXAM: CHEST - 2 VIEW COMPARISON:  None. FINDINGS: There is cardiomegaly with probable mild vascular congestion. No edema. No focal consolidation or pneumothorax. There is slight flattening of the diaphragms which may represent areas of scarring. Trace pleural effusions are less likely but not excluded. Clinical correlation is recommended. There is atherosclerotic calcification of the aortic arch. No acute osseous pathology. IMPRESSION: Cardiomegaly with mild vascular congestion. No edema or focal consolidation. Electronically Signed   By: Anner Crete M.D.   On: 02/19/2018 01:57     EKG: Independently reviewed.   Sinus rhythm, QTC 467, right bundle blockade, early R wave progression  Assessment/Plan Principal Problem:   Acute respiratory failure with hypoxia (HCC) Active Problems:   GERD (gastroesophageal reflux disease)   HLD (hyperlipidemia)   HTN (hypertension)   Hypothyroidism   Influenza   Acute CHF (congestive heart failure) (HCC)   CKD (chronic kidney disease)   Elevated troponin   Acute respiratory failure with hypoxia [facility]): Patient has a shortness of breath and oxygen desaturation to 80s on room air.  Etiology is not clear.  A potential differential diagnosis is acute CHF given positive JVD, bilateral leg edema and vascular congestion on chest x-ray.  Patient denies history of CHF, but she is on Lasix.  Given long history of hypertension, patient may have undiagnosed diastolic CHF.  Another potential differential diagnosis is PE given recent frequent long distant traveling by car. Her D-dimer is 14.4.  Patient does not have recent fall or head injury.  No signs of rectal bleeding.  Will start IV heparin empirically.  -will place on tele bed for obs -will start IV heparin per pharm. -f/u LE venous doppler - V/Q scan in AM - give one dose of IV lasix 20 mg (Avoid over-diuresis due to possible PE) -trop x 3 -2d echo -Daily weights -strict I/O's -Low salt diet  Elevated troponin: Troponin 0.83.  No chest pain.  Likely due to demand ischemia secondary to possible acute CHF.  -cycle CE q6 x3 and repeat EKG in the am  - prn Nitroglycerin, Morphine, and aspirin, pravastatin - Risk factor stratification: will check FLP and A1C  - 2d echo - card consult is requested via Epic  GERD (gastroesophageal reflux disease): -pepcid  HLD (hyperlipidemia) -pravastatin  Essential hypertension: -IV Hydralazine prn -Continue home medications: Cozaar  Hypothyroidism: -continue home Synthroid  Influenza: took two pills of Xofluza. No fever or chill now. -obsever  CKD (chronic kidney  disease): pt has hx of CKD, but baseline creatinine is not available.  Now patient has GFR 27, stage IV CKD. -Follow-up BMP.  If kidney function is worse, will need to hold off Cozaar.    DVT ppx: SQ Heparin       Code Status: Full code Family Communication:   Yes, patient's daughter and son-in-law   at bed side Disposition Plan:  Anticipate discharge back to previous home environment Consults called: None Admission status: Obs / tele     Date of Service 02/19/2018    [facility]   If 7PM-7AM, please contact night-coverage [email or web address] Password [facility] 02/19/2018, 3:48 AM

## 2018-02-19 NOTE — Progress Notes (Addendum)
PROGRESS NOTE  Morgan Harrell VFI:[id number] DOB: 11-Jan-1925 DOA: 02/19/2018 PCP: System, Pcp Not In  HPI/Recap of past 62 hours: 85 year old African-American female with past medical history of chronic kidney disease hypertension hyperlipidemia knee arthritis GERD who presented to the ER with shortness of breath initially was thought to be left cardiology was consulted.  He was found to have d-dimer of 14 VQ scan was done showed high probability for PE.  Due to the fact that she was already started on heparin for possible PE given her age and renal function a CTA was considered but for the safety of the patient and not to do anymore harm, it was discontinued I will treat her for PE based on the VQ scan result.  Doppler ultrasound of the lower extremity was negative for DVT  she was also found to have influenza and was started on Xofluza.  Subjective: Patient seen at bedside with family member her daughter Kendrick Fries and her husband in the room.  Patient is hard of hearing.  She stated that she feels much better  Assessment/Plan: Principal Problem:   Acute respiratory failure with hypoxia (HCC) Active Problems:   GERD (gastroesophageal reflux disease)   HLD (hyperlipidemia)   HTN (hypertension)   Hypothyroidism   Influenza   Acute CHF (congestive heart failure) (HCC)   CKD (chronic kidney disease)   Elevated troponin   Pulmonary embolism ([place])  #1 acute respiratory distress with hypoxia due to oxygen desaturation  2.  Pulmonary embolism VQ scan was positive for pulmonary embolism patient started on heparin drip  Due to the fact that she was already started on heparin for possible PE given her age and renal function a CTA was considered but for the safety of the patient and not to do anymore harm, it was discontinued I will treat her for PE based on the VQ scan result.  Doppler ultrasound of the lower extremity was negative for DVT.  2D echo shows normal ejection fraction but possible  pulmonary hypertension  3.  Influenza virus patient is on Xolfuza  4. hard of hearing  5.  Chronic on acute  Kidney Disease we will continue hydration and monitor  6.  Slightly troponin might be due to the pulmonary embolism, we will continue to monitor patient does not have  chest pain or palpitation  7.  Hypertension continue home medicine  Code Status: FULL  Severity of Illness: The appropriate patient status for this patient is INPATIENT. Inpatient status is judged to be reasonable and necessary in order to provide the required intensity of service to ensure the patient's safety. The patient's presenting symptoms, physical exam findings, and initial radiographic and laboratory data in the context of their chronic comorbidities is felt to place them at high risk for further clinical deterioration. Furthermore, it is not anticipated that the patient will be medically stable for discharge from the hospital within 2 midnights of admission. The following factors support the patient status of inpatient.   " The patient's presenting symptoms include shortness of breath. " The worrisome physical exam findings include the saturation of oxygen requiring increased oxygen. " The initial radiographic and laboratory data are worrisome because of pulmonary embolism new. " The chronic co-morbidities include chronic kidney disease.   * I certify that at the point of admission it is my clinical judgment that the patient will require inpatient hospital care spanning beyond 2 midnights from the point of admission due to high intensity of service, high risk for further deterioration  and high frequency of surveillance required.*    Family Communication: Daughter Kendrick Fries at bedside  Disposition Plan: When stable   Consultants:  Cardiology  Procedures:  VQ scan  Antimicrobials:  None  DVT prophylaxis: Heparin   Objective: Vitals:   02/19/18 0800 02/19/18 1017 02/19/18 1021 02/19/18 1100    BP: (!) 141/81 (!) 142/67 (!) 142/67 131/79  Pulse: 68  78 70  Resp: 16  14 16   Temp:      SpO2: 99%  100% 100%  Weight:      Height:       No intake or output data in the 24 hours ending 02/19/18 1208 Filed Weights   02/19/18 0607  Weight: 79.8 kg   Body mass index is 28.41 kg/m.  Exam:  . General: 85 y.o. year-old female well developed well nourished in no acute distress.  Alert and oriented x3. . Cardiovascular: Regular rate and rhythm with no rubs or gallops.  No thyromegaly or JVD noted.   Marland Kitchen Respiratory: Clear to auscultation with no wheezes or rales. Good inspiratory effort. . Abdomen: Soft nontender nondistended with normal bowel sounds x4 quadrants. . Musculoskeletal: 1+ pitting bilateral lower extremity edema. 2/4 pulses in all 4 extremities. . Skin: No ulcerative lesions noted or rashes, . Psychiatry: Mood is appropriate for condition and setting    Data Reviewed: CBC: Recent Labs  Lab 02/19/18 0120  WBC 7.8  NEUTROABS 6.4  HGB 11.8*  HCT 38.2  MCV 91.4  PLT 324   Basic Metabolic Panel: Recent Labs  Lab 02/19/18 0120  NA 138  K 4.5  CL 109  CO2 19*  GLUCOSE 140*  BUN 56*  CREATININE 1.84*  CALCIUM 9.0   GFR: Estimated Creatinine Clearance: 21.2 mL/min (A) (by C-G formula based on SCr of 1.84 mg/dL (H)). Liver Function Tests: Recent Labs  Lab 02/19/18 0120  AST 54*  ALT 38  ALKPHOS 70  BILITOT 0.8  PROT 7.3  ALBUMIN 3.9   No results for input(s): LIPASE, AMYLASE in the last 168 hours. No results for input(s): AMMONIA in the last 168 hours. Coagulation Profile: Recent Labs  Lab 02/19/18 0553  INR 1.03   Cardiac Enzymes: Recent Labs  Lab 02/19/18 0320  TROPONINI 0.75*   BNP (last 3 results) No results for input(s): PROBNP in the last 8760 hours. HbA1C: Recent Labs    02/19/18 0120  HGBA1C 5.8*   CBG: No results for input(s): GLUCAP in the last 168 hours. Lipid Profile: Recent Labs    02/19/18 0320  CHOL 119  HDL  37*  LDLCALC 59  TRIG 113  CHOLHDL 3.2   Thyroid Function Tests: No results for input(s): TSH, T4TOTAL, FREET4, T3FREE, THYROIDAB in the last 72 hours. Anemia Panel: No results for input(s): VITAMINB12, FOLATE, FERRITIN, TIBC, IRON, RETICCTPCT in the last 72 hours. Urine analysis: No results found for: COLORURINE, APPEARANCEUR, LABSPEC, PHURINE, GLUCOSEU, HGBUR, BILIRUBINUR, KETONESUR, PROTEINUR, UROBILINOGEN, NITRITE, LEUKOCYTESUR Sepsis Labs: @LABRCNTIP (procalcitonin:4,lacticidven:4)  )No results found for this or any previous visit (from the past 240 hour(s)).    Studies: Dg Chest 2 View  Result Date: 02/19/2018 CLINICAL DATA:  85 year old female with shortness of breath. EXAM: CHEST - 2 VIEW COMPARISON:  None. FINDINGS: There is cardiomegaly with probable mild vascular congestion. No edema. No focal consolidation or pneumothorax. There is slight flattening of the diaphragms which may represent areas of scarring. Trace pleural effusions are less likely but not excluded. Clinical correlation is recommended. There is atherosclerotic calcification of the aortic  arch. No acute osseous pathology. IMPRESSION: Cardiomegaly with mild vascular congestion. No edema or focal consolidation. Electronically Signed   By: Anner Crete M.D.   On: 02/19/2018 01:57   Nm Pulmonary Perf And Vent  Addendum Date: 02/19/2018   ADDENDUM REPORT: 02/19/2018 10:44 ADDENDUM: These results were called by telephone at the time of interpretation on 02/19/2018 at 10:44 am to Dr. Kyung Bacca, covering hospitalist , who verbally acknowledged these results. Electronically Signed   By: Lowella Grip III M.D.   On: 02/19/2018 10:44   Result Date: 02/19/2018 CLINICAL DATA:  Shortness of Breath EXAM: NUCLEAR MEDICINE VENTILATION - PERFUSION LUNG SCAN VIEWS: Anterior, posterior, left lateral, right lateral, RPO, LPO, RAO, LAO-ventilation and perfusion RADIOPHARMACEUTICALS:  31.0 mCi of Tc-93m DTPA aerosol inhalation and 4.2 mCi  Tc1m MAA IV COMPARISON:  Chest radiograph February 19, 2018 FINDINGS: Ventilation: Radiotracer uptake is homogeneous and symmetric bilaterally. No appreciable ventilation defects. Perfusion: There is absence of perfusion in the apical segment of the left upper lobe. There is also photopenia in the superior segment of the left lower lobe. There is also a perfusion defect in the anterior segment of the right upper lobe. IMPRESSION: There are apparent segmental perfusion defects that associated ventilation lesions. This study is felt to constitute a high probability of pulmonary embolus based on PIOPED II criteria. Electronically Signed: By: Lowella Grip III M.D. On: 02/19/2018 10:30   Vas [place] Lower Extremity Venous (dvt)  Result Date: 02/19/2018  Lower Venous Study Indications: Positive D-dimer.  Limitations: Imagery deficit; patient discomfort. Comparison Study: No comparison study available. Performing Technologist: Rudell Cobb  Examination Guidelines: A complete evaluation includes B-mode imaging, spectral Doppler, color Doppler, and power Doppler as needed of all accessible portions of each vessel. Bilateral testing is considered an integral part of a complete examination. Limited examinations for reoccurring indications may be performed as noted.  Right Venous Findings: +---------+---------------+---------+-----------+----------+-------------------+          CompressibilityPhasicitySpontaneityPropertiesSummary             +---------+---------------+---------+-----------+----------+-------------------+ CFV      Full           Yes      Yes                                      +---------+---------------+---------+-----------+----------+-------------------+ SFJ      Full                                                             +---------+---------------+---------+-----------+----------+-------------------+ FV Prox  Full                                                              +---------+---------------+---------+-----------+----------+-------------------+ FV Mid   Full                                                             +---------+---------------+---------+-----------+----------+-------------------+  FV Distal               Yes      Yes                  difficult to                                                              perform compression                                                       due to patient has                                                        severe pain         +---------+---------------+---------+-----------+----------+-------------------+ PFV      Full                                                             +---------+---------------+---------+-----------+----------+-------------------+ POP      Full           Yes      Yes                                      +---------+---------------+---------+-----------+----------+-------------------+ PTV      Full                                                             +---------+---------------+---------+-----------+----------+-------------------+ PERO                                                  Not visualized      +---------+---------------+---------+-----------+----------+-------------------+  Left Venous Findings: +---------+---------------+---------+-----------+----------+--------------+          CompressibilityPhasicitySpontaneityPropertiesSummary        +---------+---------------+---------+-----------+----------+--------------+ CFV      Full           Yes      Yes                                 +---------+---------------+---------+-----------+----------+--------------+ SFJ      Full                                                        +---------+---------------+---------+-----------+----------+--------------+  FV Prox  Full                                                         +---------+---------------+---------+-----------+----------+--------------+ FV Mid   Full                                                        +---------+---------------+---------+-----------+----------+--------------+ FV DistalFull                                                        +---------+---------------+---------+-----------+----------+--------------+ PFV      Full                                                        +---------+---------------+---------+-----------+----------+--------------+ POP      Full           Yes      Yes                                 +---------+---------------+---------+-----------+----------+--------------+ PTV      Full                                                        +---------+---------------+---------+-----------+----------+--------------+ PERO                                                  Not visualized +---------+---------------+---------+-----------+----------+--------------+    Summary: Right: There is no evidence of deep vein thrombosis in the lower extremity. However, portions of this examination were limited- see technologist comments above. No cystic structure found in the popliteal fossa. Left: There is no evidence of deep vein thrombosis in the lower extremity. However, portions of this examination were limited- see technologist comments above. No cystic structure found in the popliteal fossa.  *See table(s) above for measurements and observations. Electronically signed by Servando Snare MD on 02/19/2018 at 12:04:40 PM.    Final     Scheduled Meds: . aspirin EC  81 mg Oral Daily  . famotidine  20 mg Oral QHS  . ipratropium-albuterol  3 mL Nebulization TID  . levothyroxine  50 mcg Oral QAC breakfast  . losartan  100 mg Oral Daily  . pravastatin  80 mg Oral q1800  . sodium chloride flush  3 mL Intravenous Q12H    Continuous Infusions: . sodium chloride    . heparin 1,050 Units/hr (02/19/18 0820)      LOS: 0 days  Cristal Deer, MD [facility]  To reach me or the doctor on call, go to: [email or web address] Password [facility]  02/19/2018, 12:08 PM

## 2018-02-19 NOTE — ED Notes (Signed)
Notified EDP,Cardama,MD. Pt. I-stat troponin results 0.83 and RN,Bobby made aware.

## 2018-02-19 NOTE — ED Provider Notes (Signed)
[facility] DEPT Provider Note   CSN: [id number] Arrival date & time: 02/19/18  0045     History   Chief Complaint Chief Complaint  Patient presents with  . Shortness of Breath    HPI Morgan Harrell is a 85 y.o. female.  Patient presents to the emergency department with a chief complaint of shortness of breath.  She states she has felt short of breath for the past several days.  Was seen yesterday at an urgent care and was diagnosed with the flu.  She was prescribed Xofluza.  She also had some wheezing, and was given inhalers.  She denies having any pain in her chest.  She denies any prior history of heart attack.  She is not anticoagulated.  The history is provided by the patient. No language interpreter was used.    No past medical history on file.  There are no active problems to display for this patient.   History reviewed. No pertinent surgical history.   OB History   No obstetric history on file.      Home Medications    Prior to Admission medications   Not on File    Family History No family history on file.  Social History Social History   Tobacco Use  . Smoking status: Not on file  Substance Use Topics  . Alcohol use: Not on file  . Drug use: Not on file     Allergies   Codeine   Review of Systems Review of Systems  All other systems reviewed and are negative.    Physical Exam Updated Vital Signs BP 115/67   Pulse 75   Temp 98.7 F (37.1 C)   Resp 16   SpO2 100%   Physical Exam Vitals signs and nursing note reviewed.  Constitutional:      Appearance: She is well-developed.  HENT:     Head: Normocephalic and atraumatic.  Eyes:     Conjunctiva/sclera: Conjunctivae normal.     Pupils: Pupils are equal, round, and reactive to light.  Neck:     Musculoskeletal: Normal range of motion and neck supple.  Cardiovascular:     Rate and Rhythm: Normal rate and regular rhythm.     Heart sounds: No  murmur. No friction rub. No gallop.   Pulmonary:     Effort: Pulmonary effort is normal. No respiratory distress.     Breath sounds: Wheezing present. No rales.     Comments: Right upper lobe wheezing  Chest:     Chest wall: No tenderness.  Abdominal:     General: Bowel sounds are normal. There is no distension.     Palpations: Abdomen is soft. There is no mass.     Tenderness: There is no abdominal tenderness. There is no guarding or rebound.  Musculoskeletal: Normal range of motion.        General: No tenderness.     Comments: Trace to 1+ pitting edema  Skin:    General: Skin is warm and dry.  Neurological:     Mental Status: She is alert and oriented to person, place, and time.  Psychiatric:        Behavior: Behavior normal.        Thought Content: Thought content normal.        Judgment: Judgment normal.      ED Treatments / Results  Labs (all labs ordered are listed, but only abnormal results are displayed) Labs Reviewed  I-STAT TROPONIN, ED - Abnormal; Notable  for the following components:      Result Value   Troponin i, poc 0.83 (*)    All other components within normal limits  COMPREHENSIVE METABOLIC PANEL  CBC WITH DIFFERENTIAL/PLATELET    EKG EKG Interpretation  Date/Time:  Friday February 19 2018 00:55:40 EST Ventricular Rate:  77 PR Interval:    QRS Duration: 111 QT Interval:  412 QTC Calculation: 467 R Axis:   34 Text Interpretation:  Sinus rhythm Atrial premature complex Short PR interval RSR' in V1 or V2, right VCD or RVH Nonspecific T abnormalities, lateral leads NO STEMI No old tracing to compare Confirmed by Addison Lank [phone]) on 02/19/2018 1:44:52 AM   Radiology No results found.  Procedures Procedures (including critical care time) CRITICAL CARE Performed by: Montine Circle  Elevated troponin at 0.83, presumably new onset CHF requiring admission to the hospital and oxygen therapy due to hypoxia Total critical care time: 41  minutes  Critical care time was exclusive of separately billable procedures and treating other patients.  Critical care was necessary to treat or prevent imminent or life-threatening deterioration.  Critical care was time spent personally by me on the following activities: development of treatment plan with patient and/or surrogate as well as nursing, discussions with consultants, evaluation of patient's response to treatment, examination of patient, obtaining history from patient or surrogate, ordering and performing treatments and interventions, ordering and review of laboratory studies, ordering and review of radiographic studies, pulse oximetry and re-evaluation of patient's condition.  Medications Ordered in ED Medications  albuterol (PROVENTIL) (2.5 MG/3ML) 0.083% nebulizer solution 5 mg (5 mg Nebulization Given 02/19/18 0123)     Initial Impression / Assessment and Plan / ED Course  I have reviewed the triage vital signs and the nursing notes.  Pertinent labs & imaging results that were available during my care of the patient were reviewed by me and considered in my medical decision making (see chart for details).    Patient with gradually worsening shortness of breath with exertion over the past couple of weeks.  Felt like she is going to pass out at home today.  O2 saturation drops to the 80s, and low 90s on room air.  She is requiring 2 L here.  She does have some mild wheezing.  Chest x-ray shows pulmonary vascular congestion.  She does have trace edema in her lower extremities.  She has no history of CHF.  I do suspect that she has new onset CHF.  She has a mildly elevated troponin at 0.83.  She does not have any chest pain.  I doubt ACS, believe this to be demand ischemia.  There are no ischemic EKG changes.  I discussed the case with Dr. Leonette Monarch, who agrees with plan.  Appreciate Dr. Blaine Hamper, admitting the patient.  Of note, patient was tested positive for influenza yesterday at an  outside urgent care, and was treated with Xofluza.  Final Clinical Impressions(s) / ED Diagnoses   Final diagnoses:  Acute on chronic congestive heart failure, unspecified heart failure type [facility])    ED Discharge Orders    None       Montine Circle, PA-C 02/19/18 0326    Fatima Blank, MD 02/19/18 [phone]

## 2018-02-19 NOTE — Progress Notes (Signed)
  Echocardiogram 2D Echocardiogram has been performed.  Jannett Celestine 02/19/2018, 12:32 PM

## 2018-02-20 DIAGNOSIS — I2693 Single subsegmental pulmonary embolism without acute cor pulmonale: Secondary | ICD-10-CM

## 2018-02-20 DIAGNOSIS — I50811 Acute right heart failure: Secondary | ICD-10-CM

## 2018-02-20 DIAGNOSIS — N184 Chronic kidney disease, stage 4 (severe): Secondary | ICD-10-CM

## 2018-02-20 LAB — CBC WITH DIFFERENTIAL/PLATELET
Abs Immature Granulocytes: 0.03 10*3/uL (ref 0.00–0.07)
Basophils Absolute: 0 10*3/uL (ref 0.0–0.1)
Basophils Relative: 0 %
EOS PCT: 1 %
Eosinophils Absolute: 0.1 10*3/uL (ref 0.0–0.5)
HCT: 34 % — ABNORMAL LOW (ref 36.0–46.0)
Hemoglobin: 10.5 g/dL — ABNORMAL LOW (ref 12.0–15.0)
Immature Granulocytes: 0 %
Lymphocytes Relative: 21 %
Lymphs Abs: 1.4 10*3/uL (ref 0.7–4.0)
MCH: 28.8 pg (ref 26.0–34.0)
MCHC: 30.9 g/dL (ref 30.0–36.0)
MCV: 93.2 fL (ref 80.0–100.0)
Monocytes Absolute: 0.6 10*3/uL (ref 0.1–1.0)
Monocytes Relative: 9 %
Neutro Abs: 4.7 10*3/uL (ref 1.7–7.7)
Neutrophils Relative %: 69 %
Platelets: 177 10*3/uL (ref 150–400)
RBC: 3.65 MIL/uL — ABNORMAL LOW (ref 3.87–5.11)
RDW: 13.6 % (ref 11.5–15.5)
WBC: 6.8 10*3/uL (ref 4.0–10.5)
nRBC: 0 % (ref 0.0–0.2)

## 2018-02-20 LAB — BASIC METABOLIC PANEL
Anion gap: 9 (ref 5–15)
Anion gap: 8 (ref 5–15)
BUN: 46 mg/dL — ABNORMAL HIGH (ref 8–23)
BUN: 45 mg/dL — ABNORMAL HIGH (ref 8–23)
CO2: 18 mmol/L — ABNORMAL LOW (ref 22–32)
CO2: 20 mmol/L — ABNORMAL LOW (ref 22–32)
Calcium: 8.5 mg/dL — ABNORMAL LOW (ref 8.9–10.3)
Calcium: 8.6 mg/dL — ABNORMAL LOW (ref 8.9–10.3)
Chloride: 112 mmol/L — ABNORMAL HIGH (ref 98–111)
Chloride: 113 mmol/L — ABNORMAL HIGH (ref 98–111)
Creatinine, Ser: 1.43 mg/dL — ABNORMAL HIGH (ref 0.44–1.00)
Creatinine, Ser: 1.47 mg/dL — ABNORMAL HIGH (ref 0.44–1.00)
GFR calc Af Amer: 37 mL/min — ABNORMAL LOW (ref >60)
GFR calc Af Amer: 36 mL/min — ABNORMAL LOW (ref >60)
GFR calc non Af Amer: 31 mL/min — ABNORMAL LOW (ref >60)
GFR, EST NON AFRICAN AMERICAN: 32 mL/min — AB (ref >60)
GLUCOSE: 107 mg/dL — AB (ref 70–99)
Glucose, Bld: 105 mg/dL — ABNORMAL HIGH (ref 70–99)
Potassium: 4.1 mmol/L (ref 3.5–5.1)
Potassium: 4.5 mmol/L (ref 3.5–5.1)
SODIUM: 139 mmol/L (ref 135–145)
Sodium: 141 mmol/L (ref 135–145)

## 2018-02-20 LAB — CBC
HCT: 34.3 % — ABNORMAL LOW (ref 36.0–46.0)
Hemoglobin: 10.4 g/dL — ABNORMAL LOW (ref 12.0–15.0)
MCH: 28 pg (ref 26.0–34.0)
MCHC: 30.3 g/dL (ref 30.0–36.0)
MCV: 92.2 fL (ref 80.0–100.0)
Platelets: 182 10*3/uL (ref 150–400)
RBC: 3.72 MIL/uL — ABNORMAL LOW (ref 3.87–5.11)
RDW: 13.8 % (ref 11.5–15.5)
WBC: 6.7 10*3/uL (ref 4.0–10.5)
nRBC: 0 % (ref 0.0–0.2)

## 2018-02-20 LAB — HEPARIN LEVEL (UNFRACTIONATED)
HEPARIN UNFRACTIONATED: 1.01 [IU]/mL — AB (ref 0.30–0.70)
Heparin Unfractionated: 1.11 IU/mL — ABNORMAL HIGH (ref 0.30–0.70)
Heparin Unfractionated: >2.2 IU/mL — ABNORMAL HIGH (ref 0.30–0.70)
Heparin Unfractionated: 0.27 IU/mL — ABNORMAL LOW (ref 0.30–0.70)

## 2018-02-20 MED ORDER — HEPARIN (PORCINE) 25000 UT/250ML-% IV SOLN
750.0000 [IU]/h | INTRAVENOUS | Status: DC
  Administered 2018-02-20: 650 [IU]/h via INTRAVENOUS
  Administered 2018-02-21: 750 [IU]/h via INTRAVENOUS
  Filled 2018-02-20: qty 250

## 2018-02-20 NOTE — Progress Notes (Signed)
ANTICOAGULATION CONSULT NOTE - follow up  Pharmacy Consult for IV heparin Indication: r/o PE - high probability of PE per VQ scan  Allergies  Allergen Reactions  . Codeine Nausea And Vomiting    Patient Measurements: Height: 5\' 6"  (167.6 cm) Weight: 171 lb 11.2 oz (77.9 kg) IBW/kg (Calculated) : 59.3 Heparin Dosing Weight: 65 kg  Vital Signs: Temp: 98.7 F (37.1 C) (01/25 1240) Temp Source: Oral (01/25 1240) BP: 146/71 (01/25 1240) Pulse Rate: 66 (01/25 1240)  Labs: Recent Labs    02/19/18 0120 02/19/18 0320 02/19/18 0553  02/19/18 1725 02/20/18 0237 02/20/18 0323 02/20/18 0325 02/20/18 0817 02/20/18 1906  HGB 11.8*  --   --   --   --  10.5*  --  10.4*  --   --   HCT 38.2  --   --   --   --  34.0*  --  34.3*  --   --   PLT 203  --   --   --   --  177  --  182  --   --   APTT  --   --  23*  --   --   --   --   --   --   --   LABPROT  --   --  13.4  --   --   --   --   --   --   --   INR  --   --  1.03  --   --   --   --   --   --   --   HEPARINUNFRC  --   --   --    < > 0.56 1.01* 1.11*  --  >2.20* 0.27*  CREATININE 1.84*  --   --   --   --  1.43*  --  1.47*  --   --   TROPONINI  --  0.75*  --   --  0.87*  --   --   --   --   --    < > = values in this interval not displayed.    Estimated Creatinine Clearance: 26.2 mL/min (A) (by C-G formula based on SCr of 1.47 mg/dL (H)).   Medical History: Past Medical History:  Diagnosis Date  . GERD (gastroesophageal reflux disease)   . HLD (hyperlipidemia)   . HTN (hypertension)   . Hypothyroidism     Medications:  Scheduled:  . aspirin EC  81 mg Oral Daily  . famotidine  20 mg Oral QHS  . levothyroxine  50 mcg Oral QAC breakfast  . pravastatin  80 mg Oral q1800  . sodium chloride flush  3 mL Intravenous Q12H   Infusions:  . sodium chloride    . heparin 650 Units/hr (02/20/18 1117)    Assessment: 89 yoF with SOB, found to have elevated d-dimer. IV heparin for suspected PE.  Baseline labs: H/H=11.8/38.2.  Plts=203. aPtt = 23. INR=1.03  02/20/2018  Heparin level >2.2 supratherapeutic after being 1.1 early this AM on current IV heparin rate of 1050 units/hr  However, that level was known to have been drawn from same arm as the heparin was runnin so the lab was redrawn. It still seems suspicious that it has risen this much.   Hgb remains stable  Per RN, confirmed that lab this time was drawn in opposite arm as the IV heparin site and was running at ordered 1050 units/hr with no reported bleeding 20:45 -  Heparin level is slightly below therapeutic range now on 650 units/hr.    Goal of Therapy:  Heparin level 0.3-0.7 units/ml Monitor platelets by anticoagulation protocol: Yes   Plan:  1) Increase heparin infusion to 750 units/hr per protocol 2) Recheck heparin level in 8 hours 3) Daily CBC/HL  Romeo Rabon, PharmD. Mobile: [phone]. 02/20/2018,8:49 PM.

## 2018-02-20 NOTE — Progress Notes (Signed)
ANTICOAGULATION CONSULT NOTE - follow up  Pharmacy Consult for IV heparin Indication: r/o PE - high probability of PE per VQ scan  Allergies  Allergen Reactions  . Codeine Nausea And Vomiting    Patient Measurements: Height: 5\' 6"  (167.6 cm) Weight: 171 lb 11.2 oz (77.9 kg) IBW/kg (Calculated) : 59.3 Heparin Dosing Weight: 65 kg  Vital Signs: Temp: 99.1 F (37.3 C) (01/25 0537) Temp Source: Oral (01/25 0537) BP: 137/66 (01/25 0537) Pulse Rate: 71 (01/25 0951)  Labs: Recent Labs    02/19/18 0120 02/19/18 0320 02/19/18 2505  02/19/18 1725 02/20/18 0237 02/20/18 0323 02/20/18 0325 02/20/18 0817  HGB 11.8*  --   --   --   --  10.5*  --  10.4*  --   HCT 38.2  --   --   --   --  34.0*  --  34.3*  --   PLT 203  --   --   --   --  177  --  182  --   APTT  --   --  23*  --   --   --   --   --   --   LABPROT  --   --  13.4  --   --   --   --   --   --   INR  --   --  1.03  --   --   --   --   --   --   HEPARINUNFRC  --   --   --    < > 0.56 1.01* 1.11*  --  >2.20*  CREATININE 1.84*  --   --   --   --  1.43*  --  1.47*  --   TROPONINI  --  0.75*  --   --  0.87*  --   --   --   --    < > = values in this interval not displayed.    Estimated Creatinine Clearance: 26.2 mL/min (A) (by C-G formula based on SCr of 1.47 mg/dL (H)).   Medical History: Past Medical History:  Diagnosis Date  . GERD (gastroesophageal reflux disease)   . HLD (hyperlipidemia)   . HTN (hypertension)   . Hypothyroidism     Medications:  Scheduled:  . aspirin EC  81 mg Oral Daily  . famotidine  20 mg Oral QHS  . levothyroxine  50 mcg Oral QAC breakfast  . losartan  100 mg Oral Daily  . pravastatin  80 mg Oral q1800  . sodium chloride flush  3 mL Intravenous Q12H   Infusions:  . sodium chloride    . heparin 1,050 Units/hr (02/20/18 0457)    Assessment: 42 yoF with SOB, found to have elevated d-dimer. IV heparin for suspected PE.  Baseline labs: H/H=11.8/38.2. Plts=203. aPtt = 23.  INR=1.03  02/20/2018  Heparin level >2.2 supratherapeutic after being 1.1 early this AM on current IV heparin rate of 1050 units/hr  However, that level was known to have been drawn from same arm as the heparin was runnin so the lab was redrawn. It still seems suspicious that it has risen this much.   Hgb remains stable  Per RN, confirmed that lab this time was drawn in opposite arm as the IV heparin site and was running at ordered 1050 units/hr with no reported bleeding   Goal of Therapy:  Heparin level 0.3-0.7 units/ml Monitor platelets by anticoagulation protocol: Yes   Plan:  1)  Hold IV heparin x 1 hr then restart at reduced heparin drip of 650 units/hr 2) Recheck heparin level 8 hours after restart of heparin drip at reduced rate 3) Daily CBC/HL   Adrian Saran, PharmD, BCPS Pager [phone] 02/20/2018 10:09 AM

## 2018-02-20 NOTE — Progress Notes (Signed)
After notifying the lab tech and the lab about getting results from a Heparin Level this morning (@ 0400) and having no charted results, the pharmacy was notified and the pharmacy placed an order for a Heparin level to be drawn.

## 2018-02-20 NOTE — Progress Notes (Deleted)
CRITICAL VALUE ALERT

## 2018-02-20 NOTE — Progress Notes (Signed)
PROGRESS NOTE  Ronnita Paz  FUX:[id number] DOB: 1924-03-26 DOA: 02/19/2018 PCP: System, Pcp Not In   Brief Narrative: Naw Lasala is a 85 y.o. female with a history of stage IV CKD, HTN, HLD, remote colon CA, knee OA, and GERD who presented to the ED for progressive shortness of breath, leg swelling in the setting of influenza recently diagnosed at urgent care. She has been driving back and forth from home in [place], [place] to [place], [place] to be with her daughter since her husband's death (at the age of 43yrs) in Nov 2019. In the ED troponin was elevated to 0.83, creatinine 1.84 with cardiomegaly and vascular congestion on CXR. D-dimer grossly elevated, though CTA was not ordered due to renal impairment. V/Q scan was high probability for PE and heparin infusion was started.  Assessment & Plan: Principal Problem:   Acute respiratory failure with hypoxia (HCC) Active Problems:   GERD (gastroesophageal reflux disease)   HLD (hyperlipidemia)   HTN (hypertension)   Hypothyroidism   Influenza   Acute CHF (congestive heart failure) (HCC)   CKD (chronic kidney disease)   Elevated troponin   Pulmonary embolism (HCC)  Acute hypoxic respiratory failure: Due to PE with right heart strain in setting of influenza. - Continue oxygen as needed to maintain SpO2 >90%.  PE:  - U/S negative for DVT. - Echo w/severely reduced RV systolic function, RVE, RAE. - Continue heparin gtt, dose adjustment needed with elevated level. Plan to transition to reduced dose eliquis once respiratory status stabilized. Remains at high risk of decompensation due to age and echocardiogram findings.  Troponin elevation: To be expected demand ischemia in 85yo with severe right heart demand due to PE with pulmonary HTN.  - Appreciate cardiology evaluation, has signed off with no further recommendations.   Influenza: Still grossly abnormal pulmonary exam.  - Received dose of xofluza - BDs prn  HLD:  - Continue  statin  HTN:  - prn Hydralazine - Hold cozaar with CrCl < 67ml/min  Hypothyroidism:  - Continue stable dose synthroid  Presumed stage IV CKD: Based on available creatinine.  - Hold cozaar - Monitor BMP   DVT prophylaxis: Heparin Code Status: Full Family Communication: None at bedside Disposition Plan: Home w/HH per PT/OT once respiratory status stabilizes  Consultants:   None  Procedures:   None  Antimicrobials:  None new   Subjective: Feels breathing is a little better, but very short of breath with minimal exertion (I.e. just to the chair/transferring from bed). No chest pain or fever. Starting to wheeze more. Denies chest pain.   Objective: Vitals:   02/19/18 2001 02/20/18 0537 02/20/18 0951 02/20/18 1240  BP:  137/66  (!) 146/71  Pulse:  71 71 66  Resp:  20 17 18   Temp:  99.1 F (37.3 C)  98.7 F (37.1 C)  TempSrc:  Oral  Oral  SpO2: 95% 94% 92% 99%  Weight:  77.9 kg    Height:        Intake/Output Summary (Last 24 hours) at 02/20/2018 1727 Last data filed at 02/20/2018 1028 Gross per 24 hour  Intake 681.62 ml  Output 450 ml  Net 231.62 ml   Filed Weights   02/19/18 0607 02/19/18 1629 02/20/18 0537  Weight: 79.8 kg 76.6 kg 77.9 kg    Gen: Pleasant, HOH female in no distress Pulm: Non-labored tachypnea with supplemental oxygen, pan-expiratory wheezing and rhonchi bilaterally.   CV: Regular rate and rhythm. No murmur, rub, or gallop. No JVD, + pedal  edema. GI: Abdomen soft, non-tender, non-distended, with normoactive bowel sounds. No organomegaly or masses felt. Ext: Warm, no deformities Skin: No rashes, lesions or ulcers Neuro: Alert and oriented. No focal neurological deficits. Psych: Judgement and insight appear normal. Mood & affect appropriate.   Data Reviewed: I have personally reviewed following labs and imaging studies  CBC: Recent Labs  Lab 02/19/18 0120 02/20/18 0237 02/20/18 0325  WBC 7.8 6.8 6.7  NEUTROABS 6.4 4.7  --   HGB  11.8* 10.5* 10.4*  HCT 38.2 34.0* 34.3*  MCV 91.4 93.2 92.2  PLT 203 177 762   Basic Metabolic Panel: Recent Labs  Lab 02/19/18 0120 02/20/18 0237 02/20/18 0325  NA 138 139 141  K 4.5 4.1 4.5  CL 109 112* 113*  CO2 19* 18* 20*  GLUCOSE 140* 105* 107*  BUN 56* 46* 45*  CREATININE 1.84* 1.43* 1.47*  CALCIUM 9.0 8.5* 8.6*   GFR: Estimated Creatinine Clearance: 26.2 mL/min (A) (by C-G formula based on SCr of 1.47 mg/dL (H)). Liver Function Tests: Recent Labs  Lab 02/19/18 0120  AST 54*  ALT 38  ALKPHOS 70  BILITOT 0.8  PROT 7.3  ALBUMIN 3.9   No results for input(s): LIPASE, AMYLASE in the last 168 hours. No results for input(s): AMMONIA in the last 168 hours. Coagulation Profile: Recent Labs  Lab 02/19/18 0553  INR 1.03   Cardiac Enzymes: Recent Labs  Lab 02/19/18 0320 02/19/18 1725  TROPONINI 0.75* 0.87*   BNP (last 3 results) No results for input(s): PROBNP in the last 8760 hours. HbA1C: Recent Labs    02/19/18 0120  HGBA1C 5.8*   CBG: No results for input(s): GLUCAP in the last 168 hours. Lipid Profile: Recent Labs    02/19/18 0320  CHOL 119  HDL 37*  LDLCALC 59  TRIG 113  CHOLHDL 3.2   Thyroid Function Tests: No results for input(s): TSH, T4TOTAL, FREET4, T3FREE, THYROIDAB in the last 72 hours. Anemia Panel: No results for input(s): VITAMINB12, FOLATE, FERRITIN, TIBC, IRON, RETICCTPCT in the last 72 hours. Urine analysis: No results found for: COLORURINE, APPEARANCEUR, LABSPEC, PHURINE, GLUCOSEU, HGBUR, BILIRUBINUR, KETONESUR, PROTEINUR, UROBILINOGEN, NITRITE, LEUKOCYTESUR No results found for this or any previous visit (from the past 240 hour(s)).    Radiology Studies: Dg Chest 2 View  Result Date: 02/19/2018 CLINICAL DATA:  85 year old female with shortness of breath. EXAM: CHEST - 2 VIEW COMPARISON:  None. FINDINGS: There is cardiomegaly with probable mild vascular congestion. No edema. No focal consolidation or pneumothorax. There is  slight flattening of the diaphragms which may represent areas of scarring. Trace pleural effusions are less likely but not excluded. Clinical correlation is recommended. There is atherosclerotic calcification of the aortic arch. No acute osseous pathology. IMPRESSION: Cardiomegaly with mild vascular congestion. No edema or focal consolidation. Electronically Signed   By: Anner Crete M.D.   On: 02/19/2018 01:57   Nm Pulmonary Perf And Vent  Addendum Date: 02/19/2018   ADDENDUM REPORT: 02/19/2018 10:44 ADDENDUM: These results were called by telephone at the time of interpretation on 02/19/2018 at 10:44 am to Dr. Kyung Bacca, covering hospitalist , who verbally acknowledged these results. Electronically Signed   By: Lowella Grip III M.D.   On: 02/19/2018 10:44   Result Date: 02/19/2018 CLINICAL DATA:  Shortness of Breath EXAM: NUCLEAR MEDICINE VENTILATION - PERFUSION LUNG SCAN VIEWS: Anterior, posterior, left lateral, right lateral, RPO, LPO, RAO, LAO-ventilation and perfusion RADIOPHARMACEUTICALS:  31.0 mCi of Tc-64m DTPA aerosol inhalation and 4.2 mCi Tc71m MAA  IV COMPARISON:  Chest radiograph February 19, 2018 FINDINGS: Ventilation: Radiotracer uptake is homogeneous and symmetric bilaterally. No appreciable ventilation defects. Perfusion: There is absence of perfusion in the apical segment of the left upper lobe. There is also photopenia in the superior segment of the left lower lobe. There is also a perfusion defect in the anterior segment of the right upper lobe. IMPRESSION: There are apparent segmental perfusion defects that associated ventilation lesions. This study is felt to constitute a high probability of pulmonary embolus based on PIOPED II criteria. Electronically Signed: By: Lowella Grip III M.D. On: 02/19/2018 10:30   Vas [place] Lower Extremity Venous (dvt)  Result Date: 02/19/2018  Lower Venous Study Indications: Positive D-dimer.  Limitations: Imagery deficit; patient discomfort. Comparison  Study: No comparison study available. Performing Technologist: Rudell Cobb  Examination Guidelines: A complete evaluation includes B-mode imaging, spectral Doppler, color Doppler, and power Doppler as needed of all accessible portions of each vessel. Bilateral testing is considered an integral part of a complete examination. Limited examinations for reoccurring indications may be performed as noted.  Right Venous Findings: +---------+---------------+---------+-----------+----------+-------------------+          CompressibilityPhasicitySpontaneityPropertiesSummary             +---------+---------------+---------+-----------+----------+-------------------+ CFV      Full           Yes      Yes                                      +---------+---------------+---------+-----------+----------+-------------------+ SFJ      Full                                                             +---------+---------------+---------+-----------+----------+-------------------+ FV Prox  Full                                                             +---------+---------------+---------+-----------+----------+-------------------+ FV Mid   Full                                                             +---------+---------------+---------+-----------+----------+-------------------+ FV Distal               Yes      Yes                  difficult to                                                              perform compression  due to patient has                                                        severe pain         +---------+---------------+---------+-----------+----------+-------------------+ PFV      Full                                                             +---------+---------------+---------+-----------+----------+-------------------+ POP      Full           Yes      Yes                                       +---------+---------------+---------+-----------+----------+-------------------+ PTV      Full                                                             +---------+---------------+---------+-----------+----------+-------------------+ PERO                                                  Not visualized      +---------+---------------+---------+-----------+----------+-------------------+  Left Venous Findings: +---------+---------------+---------+-----------+----------+--------------+          CompressibilityPhasicitySpontaneityPropertiesSummary        +---------+---------------+---------+-----------+----------+--------------+ CFV      Full           Yes      Yes                                 +---------+---------------+---------+-----------+----------+--------------+ SFJ      Full                                                        +---------+---------------+---------+-----------+----------+--------------+ FV Prox  Full                                                        +---------+---------------+---------+-----------+----------+--------------+ FV Mid   Full                                                        +---------+---------------+---------+-----------+----------+--------------+ FV DistalFull                                                        +---------+---------------+---------+-----------+----------+--------------+  PFV      Full                                                        +---------+---------------+---------+-----------+----------+--------------+ POP      Full           Yes      Yes                                 +---------+---------------+---------+-----------+----------+--------------+ PTV      Full                                                        +---------+---------------+---------+-----------+----------+--------------+ PERO                                                   Not visualized +---------+---------------+---------+-----------+----------+--------------+    Summary: Right: There is no evidence of deep vein thrombosis in the lower extremity. However, portions of this examination were limited- see technologist comments above. No cystic structure found in the popliteal fossa. Left: There is no evidence of deep vein thrombosis in the lower extremity. However, portions of this examination were limited- see technologist comments above. No cystic structure found in the popliteal fossa.  *See table(s) above for measurements and observations. Electronically signed by Servando Snare MD on 02/19/2018 at 12:04:40 PM.    Final     Scheduled Meds: . aspirin EC  81 mg Oral Daily  . famotidine  20 mg Oral QHS  . levothyroxine  50 mcg Oral QAC breakfast  . losartan  100 mg Oral Daily  . pravastatin  80 mg Oral q1800  . sodium chloride flush  3 mL Intravenous Q12H   Continuous Infusions: . sodium chloride    . heparin 650 Units/hr (02/20/18 1117)     LOS: 1 day   Time spent: 35 minutes.  Patrecia Pour, MD [facility] [email or web address] Password [facility] 02/20/2018, 5:27 PM

## 2018-02-20 NOTE — Progress Notes (Signed)
       Dr Elmarie Shiley consult note reviewed. Patient ultimately diagnosed with a PE which explains there presentation. Echo shows normal LV function, has some RV dysfunction likely related to PE, hemodynamics are stable. No plans for further cardiac testing, troponin is related to PE.   We will sign off inpatient care    [place] will sign off.   Medication Recommendations:  No specific cardiac recs Other recommendations (labs, testing, etc):  none Follow up as an outpatient:  none     Signed, Carlyle Dolly, MD  02/20/2018, 8:43 AM

## 2018-02-20 NOTE — Evaluation (Signed)
Occupational Therapy Evaluation Patient Details Name: Morgan Harrell MRN: [id number] DOB: [birth year] Today's Date: 02/20/2018    History of Present Illness 85 y.o. female with a hx of CKD, hypertension, hyperlipidemia, remote colon cancer 1996, arthritis, bilateral carpal tunnel, GERD and osteoarthritis of the knees and admitted for shortness of breath/congestive heart failure and elevated troponin (also thought to possibly have PE).   Clinical Impression   Pt admitted with the above. Pt currently with functional limitations due to the deficits listed below (see OT Problem List).  Pt will benefit from skilled OT to increase their safety and independence with ADL and functional mobility for ADL to facilitate discharge to venue listed below.      Follow Up Recommendations  Home health OT;Supervision - Intermittent    Equipment Recommendations  3 in 1 bedside commode    Recommendations for Other Services       Precautions / Restrictions Precautions Precautions: Fall Precaution Comments: droplet, monitor sats      Mobility Bed Mobility               General bed mobility comments: pt up in recliner on arrival  Transfers Overall transfer level: Needs assistance Equipment used: Rolling walker (2 wheeled) Transfers: Sit to/from Stand Sit to Stand: Min guard         General transfer comment: verbal cues for safe technique    Balance Overall balance assessment: Needs assistance         Standing balance support: No upper extremity supported Standing balance-Leahy Scale: Fair Standing balance comment: fair static, poor dynamic                           ADL either performed or assessed with clinical judgement   ADL Overall ADL's : Needs assistance/impaired Eating/Feeding: Set up;Sitting   Grooming: Set up;Sitting   Upper Body Bathing: Set up;Sitting   Lower Body Bathing: Moderate assistance;Sit to/from stand;Cueing for sequencing;Cueing for safety   Upper Body Dressing : Set up;Sitting   Lower Body Dressing: Moderate assistance;Sit to/from stand;Cueing for safety;Cueing for sequencing   Toilet Transfer: Minimal assistance;RW   Toileting- Clothing Manipulation and Hygiene: Minimal assistance;Sit to/from stand;Cueing for sequencing;Cueing for safety               Vision Patient Visual Report: No change from baseline       Perception     Praxis      Pertinent Vitals/Pain Pain Assessment: Faces Pain Score: 2  Faces Pain Scale: Hurts little more Pain Location: bil knees (OA) Pain Descriptors / Indicators: Sore Pain Intervention(s): Monitored during session;Limited activity within patient's tolerance     Hand Dominance     Extremity/Trunk Assessment Upper Extremity Assessment Upper Extremity Assessment: Generalized weakness   Lower Extremity Assessment Lower Extremity Assessment: RLE deficits/detail;LLE deficits/detail RLE Deficits / Details: hx of knee OA, limited functional knee flexion per pt       Communication Communication Communication: HOH   Cognition Arousal/Alertness: Awake/alert Behavior During Therapy: WFL for tasks assessed/performed Overall Cognitive Status: Within Functional Limits for tasks assessed                                                Home Living Family/patient expects to be discharged to:: Private residence Living Arrangements: Children   Type of Home: House  Home Layout: One level     Bathroom Shower/Tub: Walk-in shower         Home Equipment: Environmental consultant - 2 wheels   Additional Comments: pt from [facility], in town visiting/staying with her daughter      Prior Functioning/Environment Level of Independence: Independent                 OT Problem List: Decreased strength;Decreased activity tolerance;Decreased safety awareness      OT Treatment/Interventions: Self-care/ADL training;Patient/family education;DME and/or AE instruction    OT  Goals(Current goals can be found in the care plan section) Acute Rehab OT Goals Patient Stated Goal: home to [facility] eventually OT Goal Formulation: With patient Time For Goal Achievement: 03/06/18 Potential to Achieve Goals: Good  OT Frequency: Min 2X/week   Barriers to D/C:               AM-PAC OT "6 Clicks" Daily Activity     Outcome Measure Help from another person eating meals?: None Help from another person taking care of personal grooming?: None Help from another person toileting, which includes using toliet, bedpan, or urinal?: A Little Help from another person bathing (including washing, rinsing, drying)?: A Little Help from another person to put on and taking off regular upper body clothing?: None Help from another person to put on and taking off regular lower body clothing?: A Little 6 Click Score: 21   End of Session Equipment Utilized During Treatment: Rolling walker Nurse Communication: Mobility status  Activity Tolerance: Patient tolerated treatment well Patient left: in chair;with call bell/phone within reach;with chair alarm set  OT Visit Diagnosis: Unsteadiness on feet (R26.81);Muscle weakness (generalized) (M62.81)                Time: 0034-9179 OT Time Calculation (min): 15 min Charges:  OT General Charges $OT Visit: 1 Visit OT Evaluation $OT Eval Low Complexity: 1 Low  Kari Baars, OT Acute Rehabilitation Services [phone] Office- [phone], Edwena Felty D 02/20/2018, 4:58 PM

## 2018-02-20 NOTE — Evaluation (Signed)
Physical Therapy Evaluation Patient Details Name: Morgan Harrell MRN: [id number] DOB: [birth year] Today's Date: 02/20/2018   History of Present Illness  85 y.o. female with a hx of CKD, hypertension, hyperlipidemia, remote colon cancer 1996, arthritis, bilateral carpal tunnel, GERD and osteoarthritis of the knees and admitted for shortness of breath/congestive heart failure and elevated troponin (also thought to possibly have PE).  Clinical Impression  Pt admitted with above diagnosis. Pt currently with functional limitations due to the deficits listed below (see PT Problem List).  Pt will benefit from skilled PT to increase their independence and safety with mobility to allow discharge to the venue listed below.  Pt very agreeable to mobilize and reports feeling much better today.  Pt requested assist to bathroom and then ambulated in hallway.  Pt's SPO2 91% on room air in bathroom and upon ambulating in hallway however reapplied 2L O2 [place] per RN request upon returning to room.  Pt reports she has a RW she can use upon d/c until feeling back to baseline.  Pt reports she is typically very careful and very independent prior to this admission.     Follow Up Recommendations Home health PT;Supervision for mobility/OOB    Equipment Recommendations  None recommended by PT    Recommendations for Other Services       Precautions / Restrictions Precautions Precautions: Fall Precaution Comments: droplet, monitor sats  Pt on heparin, so utilized +2 for safety today however pt is currently min/guard assist.     Mobility  Bed Mobility               General bed mobility comments: pt up in recliner on arrival  Transfers Overall transfer level: Needs assistance Equipment used: Rolling walker (2 wheeled) Transfers: Sit to/from Stand Sit to Stand: Min guard         General transfer comment: verbal cues for safe technique  Ambulation/Gait Ambulation/Gait assistance: Min guard Gait  Distance (Feet): 100 Feet Assistive device: Rolling walker (2 wheeled) Gait Pattern/deviations: Step-through pattern;Decreased stride length;Trunk flexed;Wide base of support     General Gait Details: pt reports chronic knee pain; utilized RW for safety; cues for RW positioning, SPo2 91% on room air during ambulation (reapplied 2L O2 [place] per RN upon returning to room)  Stairs            Wheelchair Mobility    Modified Rankin (Stroke Patients Only)       Balance Overall balance assessment: Needs assistance         Standing balance support: No upper extremity supported Standing balance-Leahy Scale: Fair Standing balance comment: fair static, poor dynamic                             Pertinent Vitals/Pain Pain Assessment: Faces Faces Pain Scale: Hurts little more Pain Location: bil knees (OA) Pain Descriptors / Indicators: Sore Pain Intervention(s): Monitored during session;Limited activity within patient's tolerance    Home Living Family/patient expects to be discharged to:: Private residence Living Arrangements: Children   Type of Home: [place]: One level Home Equipment: Environmental consultant - 2 wheels Additional Comments: pt from [facility], in town visiting/staying with her daughter    Prior Function Level of Independence: Independent               Hand Dominance        Extremity/Trunk Assessment        Lower Extremity Assessment  Lower Extremity Assessment: RLE deficits/detail;LLE deficits/detail RLE Deficits / Details: hx of knee OA, limited functional knee flexion per pt       Communication   Communication: HOH  Cognition Arousal/Alertness: Awake/alert Behavior During Therapy: WFL for tasks assessed/performed Overall Cognitive Status: Within Functional Limits for tasks assessed                                        General Comments      Exercises     Assessment/Plan    PT Assessment Patient needs  continued PT services  PT Problem List Decreased strength;Decreased mobility;Cardiopulmonary status limiting activity;Decreased knowledge of use of DME;Decreased activity tolerance       PT Treatment Interventions Functional mobility training;DME instruction;Balance training;Patient/family education;Gait training;Therapeutic activities;Neuromuscular re-education;Stair training;Therapeutic exercise    PT Goals (Current goals can be found in the Care Plan section)  Acute Rehab PT Goals PT Goal Formulation: With patient Time For Goal Achievement: 03/06/18 Potential to Achieve Goals: Good    Frequency Min 3X/week   Barriers to discharge        Co-evaluation               AM-PAC PT "6 Clicks" Mobility  Outcome Measure Help needed turning from your back to your side while in a flat bed without using bedrails?: A Little Help needed moving from lying on your back to sitting on the side of a flat bed without using bedrails?: A Little Help needed moving to and from a bed to a chair (including a wheelchair)?: A Little Help needed standing up from a chair using your arms (e.g., wheelchair or bedside chair)?: A Little Help needed to walk in hospital room?: A Little Help needed climbing 3-5 steps with a railing? : A Lot 6 Click Score: 17    End of Session Equipment Utilized During Treatment: Gait belt Activity Tolerance: Patient tolerated treatment well Patient left: in chair;with call bell/phone within reach;with chair alarm set Nurse Communication: Mobility status PT Visit Diagnosis: Difficulty in walking, not elsewhere classified (R26.2)    Time: 4270-6237 PT Time Calculation (min) (ACUTE ONLY): 24 min   Charges:   PT Evaluation $PT Eval Moderate Complexity: [place], PT, DPT Acute Rehabilitation Services Office: [phone] Pager: [phone]  Trena Platt 02/20/2018, 3:38 PM

## 2018-02-21 LAB — BASIC METABOLIC PANEL
Anion gap: 10 (ref 5–15)
BUN: 40 mg/dL — ABNORMAL HIGH (ref 8–23)
CO2: 18 mmol/L — AB (ref 22–32)
Calcium: 8.9 mg/dL (ref 8.9–10.3)
Chloride: 112 mmol/L — ABNORMAL HIGH (ref 98–111)
Creatinine, Ser: 1.4 mg/dL — ABNORMAL HIGH (ref 0.44–1.00)
GFR calc Af Amer: 38 mL/min — ABNORMAL LOW (ref >60)
GFR calc non Af Amer: 33 mL/min — ABNORMAL LOW (ref >60)
GLUCOSE: 98 mg/dL (ref 70–99)
Potassium: 4.4 mmol/L (ref 3.5–5.1)
Sodium: 140 mmol/L (ref 135–145)

## 2018-02-21 LAB — CBC
HCT: 37.3 % (ref 36.0–46.0)
Hemoglobin: 11.3 g/dL — ABNORMAL LOW (ref 12.0–15.0)
MCH: 28.1 pg (ref 26.0–34.0)
MCHC: 30.3 g/dL (ref 30.0–36.0)
MCV: 92.8 fL (ref 80.0–100.0)
Platelets: 199 10*3/uL (ref 150–400)
RBC: 4.02 MIL/uL (ref 3.87–5.11)
RDW: 13.6 % (ref 11.5–15.5)
WBC: 5.8 10*3/uL (ref 4.0–10.5)
nRBC: 0 % (ref 0.0–0.2)

## 2018-02-21 LAB — HEPARIN LEVEL (UNFRACTIONATED)
Heparin Unfractionated: 0.47 IU/mL (ref 0.30–0.70)
Heparin Unfractionated: 0.32 IU/mL (ref 0.30–0.70)

## 2018-02-21 MED ORDER — SENNOSIDES-DOCUSATE SODIUM 8.6-50 MG PO TABS: 1.0000 | ORAL_TABLET | Freq: Two times a day (BID) | ORAL | Status: DC | PRN

## 2018-02-21 MED ORDER — POLYETHYLENE GLYCOL 3350 17 G PO PACK
17.0000 g | PACK | Freq: Every day | ORAL | Status: DC | PRN
  Administered 2018-02-21: 17 g via ORAL
  Filled 2018-02-21: qty 1

## 2018-02-21 NOTE — Progress Notes (Signed)
ANTICOAGULATION CONSULT NOTE - follow up  Pharmacy Consult for IV heparin Indication: r/o PE - high probability of PE per VQ scan  Allergies  Allergen Reactions  . Codeine Nausea And Vomiting    Patient Measurements: Height: 5\' 6"  (167.6 cm) Weight: 170 lb 3.1 oz (77.2 kg) IBW/kg (Calculated) : 59.3 Heparin Dosing Weight: 65 kg  Vital Signs: Temp: 98.3 F (36.8 C) (01/26 0954) Temp Source: Oral (01/26 0954) BP: 125/77 (01/26 0954) Pulse Rate: 59 (01/26 0954)  Labs: Recent Labs    02/19/18 0320 02/19/18 4128  02/19/18 1725 02/20/18 7867  02/20/18 0325 02/20/18 0817 02/20/18 1906 02/21/18 0817  HGB  --   --   --   --  10.5*  --  10.4*  --   --  11.3*  HCT  --   --   --   --  34.0*  --  34.3*  --   --  37.3  PLT  --   --   --   --  177  --  182  --   --  199  APTT  --  23*  --   --   --   --   --   --   --   --   LABPROT  --  13.4  --   --   --   --   --   --   --   --   INR  --  1.03  --   --   --   --   --   --   --   --   HEPARINUNFRC  --   --    < > 0.56 1.01*   < >  --  >2.20* 0.27* 0.47  CREATININE  --   --   --   --  1.43*  --  1.47*  --   --  1.40*  TROPONINI 0.75*  --   --  0.87*  --   --   --   --   --   --    < > = values in this interval not displayed.    Estimated Creatinine Clearance: 27.5 mL/min (A) (by C-G formula based on SCr of 1.4 mg/dL (H)).   Medical History: Past Medical History:  Diagnosis Date  . GERD (gastroesophageal reflux disease)   . HLD (hyperlipidemia)   . HTN (hypertension)   . Hypothyroidism     Medications:  Scheduled:  . aspirin EC  81 mg Oral Daily  . famotidine  20 mg Oral QHS  . levothyroxine  50 mcg Oral QAC breakfast  . pravastatin  80 mg Oral q1800  . sodium chloride flush  3 mL Intravenous Q12H   Infusions:  . sodium chloride    . heparin 750 Units/hr (02/20/18 2211)    Assessment: 37 yoF with SOB, found to have elevated d-dimer. IV heparin for suspected PE.  Baseline labs: H/H=11.8/38.2. Plts=203. aPtt =  23. INR=1.03  02/21/2018  Heparin level finally stabilizing and therapeutic on current IV heparin rate of 750 units/hr  Hgb and plts remains stable  NO reported bleeding  Goal of Therapy:  Heparin level 0.3-0.7 units/ml Monitor platelets by anticoagulation protocol: Yes   Plan:  1) Continue heparin infusion at 750 units/hr 2) Will recheck heparin level at 1600 to confirm continued goal level at current IV heparin rate 3) Daily CBC/HL   Adrian Saran, PharmD, BCPS Pager [phone] 02/21/2018 11:03 AM

## 2018-02-21 NOTE — Progress Notes (Signed)
PROGRESS NOTE  Morgan Harrell  JQB:[id number] DOB: May 27, 1924 DOA: 02/19/2018 PCP: System, Pcp Not In   Brief Narrative: Morgan Harrell is a 85 y.o. female with a history of stage IV CKD, HTN, HLD, remote colon CA, knee OA, and GERD who presented to the ED for progressive shortness of breath, leg swelling in the setting of influenza recently diagnosed at urgent care. She has been driving back and forth from [place] in [place], [place] to [place], [place] to be with her daughter since her husband's death (at the age of 56yrs) in Nov 2019. In the ED troponin was elevated to 0.83, creatinine 1.84 with cardiomegaly and vascular congestion on CXR. D-dimer grossly elevated, though CTA was not ordered due to renal impairment. V/Q scan was high probability for PE and heparin infusion was started.  Assessment & Plan: Principal Problem:   Acute respiratory failure with hypoxia (HCC) Active Problems:   GERD (gastroesophageal reflux disease)   HLD (hyperlipidemia)   HTN (hypertension)   Hypothyroidism   Influenza   Acute CHF (congestive heart failure) (HCC)   CKD (chronic kidney disease)   Elevated troponin   Pulmonary embolism (HCC)  Acute hypoxic respiratory failure: Due to PE with right heart strain in setting of influenza. No hx obstructive lung disease.  - Continue BDs as below and heparin. - Continue oxygen as needed to maintain SpO2 >90%.  PE:  - U/S negative for DVT. - Echo w/severely reduced RV systolic function, RVE, RAE. - Continue heparin gtt, dose adjustment needed with elevated level. Plan to transition to reduced dose eliquis once respiratory status stabilized. Remains at high risk of decompensation due to age and echocardiogram findings.  Troponin elevation: To be expected demand ischemia in 85yo with severe right heart demand due to PE with pulmonary HTN.  - Appreciate cardiology evaluation, has signed off with no further recommendations.   Influenza: Still grossly abnormal pulmonary  exam.  - Received dose of xofluza - Continue BDs prn  HLD:  - Continue statin  HTN:  - prn Hydralazine - Hold cozaar with CrCl < 45ml/min  Hypothyroidism:  - Continue stable dose synthroid  Presumed stage IV CKD: Based on available creatinine.  - Hold cozaar - Monitor BMP   DVT prophylaxis: Heparin gtt Code Status: Full Family Communication: Daughter at bedside Disposition Plan: Home w/HH per PT/OT once respiratory status stabilizes, still grossly abnormal exam and not at baseline.  Consultants:   None  Procedures:   None  Antimicrobials:  None new, received xofluza.  Subjective: Wants to go home but states she got severely short of breath getting up to the bathroom despite oxygen. No chest pain or fever. Hears herself wheezing (and is hard of hearing).   Objective: Vitals:   02/20/18 2244 02/21/18 0530 02/21/18 0954 02/21/18 1433  BP: 132/67 131/71 125/77 137/69  Pulse: 67 64 (!) 59 65  Resp: 16 16 18 18   Temp: 98.2 F (36.8 C) 98 F (36.7 C) 98.3 F (36.8 C) 98.7 F (37.1 C)  TempSrc: Oral Oral Oral Oral  SpO2: 100% 99% 100% 98%  Weight:  77.2 kg    Height:        Intake/Output Summary (Last 24 hours) at 02/21/2018 1913 Last data filed at 02/21/2018 1510 Gross per 24 hour  Intake 455.88 ml  Output 700 ml  Net -244.12 ml   Filed Weights   02/19/18 1629 02/20/18 0537 02/21/18 0530  Weight: 76.6 kg 77.9 kg 77.2 kg   Gen: Elderly female in no distress  Pulm: Mildly labored with exertion, on supplemental oxygen, diffuse rhonchi and wheezing/coarse breath sounds bilaterally. CV: Regular rate and rhythm. No murmur, rub, or gallop. No JVD, trace dependent edema. GI: Abdomen soft, non-tender, non-distended, with normoactive bowel sounds.  Ext: Warm, no deformities Skin: No rashes, lesions or ulcers on visualized skin. Neuro: Alert and oriented. HOH, otherwise no focal neurological deficits. Psych: Judgement and insight appear fair. Mood euthymic & affect  congruent. Behavior is appropriate.    Data Reviewed: I have personally reviewed following labs and imaging studies  CBC: Recent Labs  Lab 02/19/18 0120 02/20/18 0237 02/20/18 0325 02/21/18 0817  WBC 7.8 6.8 6.7 5.8  NEUTROABS 6.4 4.7  --   --   HGB 11.8* 10.5* 10.4* 11.3*  HCT 38.2 34.0* 34.3* 37.3  MCV 91.4 93.2 92.2 92.8  PLT 203 177 182 601   Basic Metabolic Panel: Recent Labs  Lab 02/19/18 0120 02/20/18 0237 02/20/18 0325 02/21/18 0817  NA 138 139 141 140  K 4.5 4.1 4.5 4.4  CL 109 112* 113* 112*  CO2 19* 18* 20* 18*  GLUCOSE 140* 105* 107* 98  BUN 56* 46* 45* 40*  CREATININE 1.84* 1.43* 1.47* 1.40*  CALCIUM 9.0 8.5* 8.6* 8.9   GFR: Estimated Creatinine Clearance: 27.5 mL/min (A) (by C-G formula based on SCr of 1.4 mg/dL (H)). Liver Function Tests: Recent Labs  Lab 02/19/18 0120  AST 54*  ALT 38  ALKPHOS 70  BILITOT 0.8  PROT 7.3  ALBUMIN 3.9   Coagulation Profile: Recent Labs  Lab 02/19/18 0553  INR 1.03   Cardiac Enzymes: Recent Labs  Lab 02/19/18 0320 02/19/18 1725  TROPONINI 0.75* 0.87*   HbA1C: Recent Labs    02/19/18 0120  HGBA1C 5.8*   Lipid Profile: Recent Labs    02/19/18 0320  CHOL 119  HDL 37*  LDLCALC 59  TRIG 113  CHOLHDL 3.2    Scheduled Meds: . aspirin EC  81 mg Oral Daily  . famotidine  20 mg Oral QHS  . levothyroxine  50 mcg Oral QAC breakfast  . pravastatin  80 mg Oral q1800  . sodium chloride flush  3 mL Intravenous Q12H   Continuous Infusions: . sodium chloride    . heparin 750 Units/hr (02/21/18 1510)     LOS: 2 days   Time spent: 25 minutes.  Patrecia Pour, MD [facility] [email or web address] Password [facility] 02/21/2018, 7:13 PM

## 2018-02-21 NOTE — Plan of Care (Signed)
  Problem: Education: Goal: Knowledge of General Education information will improve Description Including pain rating scale, medication(s)/side effects and non-pharmacologic comfort measures Outcome: Progressing   Problem: Health Behavior/Discharge Planning: Goal: Ability to manage health-related needs will improve Outcome: Progressing   Problem: Clinical Measurements: Goal: Ability to maintain clinical measurements within normal limits will improve Outcome: Progressing Goal: Will remain free from infection Outcome: Progressing Goal: Diagnostic test results will improve Outcome: Progressing Goal: Respiratory complications will improve Outcome: Progressing Note:  Pt reports improvement in resp status since admission.  Goal: Cardiovascular complication will be avoided Outcome: Progressing   Problem: Nutrition: Goal: Adequate nutrition will be maintained Outcome: Progressing   Problem: Elimination: Goal: Will not experience complications related to bowel motility Outcome: Progressing   Problem: Pain Managment: Goal: General experience of comfort will improve Outcome: Progressing   Problem: Safety: Goal: Ability to remain free from injury will improve Outcome: Progressing   Problem: Skin Integrity: Goal: Risk for impaired skin integrity will decrease Outcome: Progressing

## 2018-02-21 NOTE — Progress Notes (Signed)
Brief Consult Note - IV heparin follow up  Labs: heparin level 0.32  A/P: heparin level at goal x 2 now today on current IV heparin rate of 750 units/hr. No reported bleeding or complications. Continue current IV heparin rate. Daily HL and CBC  Adrian Saran, PharmD, BCPS Pager [phone] 02/21/2018 5:37 PM

## 2018-02-22 LAB — CBC
HCT: 32.8 % — ABNORMAL LOW (ref 36.0–46.0)
Hemoglobin: 9.9 g/dL — ABNORMAL LOW (ref 12.0–15.0)
MCH: 28.3 pg (ref 26.0–34.0)
MCHC: 30.2 g/dL (ref 30.0–36.0)
MCV: 93.7 fL (ref 80.0–100.0)
NRBC: 0 % (ref 0.0–0.2)
Platelets: 183 10*3/uL (ref 150–400)
RBC: 3.5 MIL/uL — ABNORMAL LOW (ref 3.87–5.11)
RDW: 13.6 % (ref 11.5–15.5)
WBC: 6.7 10*3/uL (ref 4.0–10.5)

## 2018-02-22 LAB — HEPARIN LEVEL (UNFRACTIONATED): Heparin Unfractionated: 0.26 IU/mL — ABNORMAL LOW (ref 0.30–0.70)

## 2018-02-22 LAB — BASIC METABOLIC PANEL
Anion gap: 8 (ref 5–15)
BUN: 41 mg/dL — ABNORMAL HIGH (ref 8–23)
CALCIUM: 8.3 mg/dL — AB (ref 8.9–10.3)
CO2: 19 mmol/L — AB (ref 22–32)
Chloride: 111 mmol/L (ref 98–111)
Creatinine, Ser: 1.37 mg/dL — ABNORMAL HIGH (ref 0.44–1.00)
GFR calc non Af Amer: 34 mL/min — ABNORMAL LOW (ref >60)
GFR, EST AFRICAN AMERICAN: 39 mL/min — AB (ref >60)
Glucose, Bld: 97 mg/dL (ref 70–99)
Potassium: 4.3 mmol/L (ref 3.5–5.1)
Sodium: 138 mmol/L (ref 135–145)

## 2018-02-22 MED ORDER — APIXABAN 5 MG PO TABS: 5.0000 mg | ORAL_TABLET | Freq: Two times a day (BID) | ORAL | Status: DC

## 2018-02-22 MED ORDER — APIXABAN 5 MG PO TABS: ORAL_TABLET | ORAL | 0 refills | Status: DC

## 2018-02-22 MED ORDER — HEPARIN (PORCINE) 25000 UT/250ML-% IV SOLN: 850.0000 [IU]/h | INTRAVENOUS | Status: DC

## 2018-02-22 MED ORDER — APIXABAN 5 MG PO TABS
10.0000 mg | ORAL_TABLET | Freq: Two times a day (BID) | ORAL | Status: DC
  Administered 2018-02-22: 10 mg via ORAL
  Filled 2018-02-22: qty 2

## 2018-02-22 NOTE — Care Management Note (Signed)
Case Management Note  Patient Details  Name: Morgan Harrell MRN: [id number] Date of Birth: 07-20-1924  Subjective/Objective: Patient will stay w/dtr @ [facility] chosen for [facility] aware of d/c & HHC orders. Patient will get 3n1 on own if needed. Eliquis retail cost $42, $195 for 90 day supply-patient voiced understanding. Patient has a 30day eliquis discount coupon in rm.No further CM needs.                  Action/Plan:d/c plan home w/HHC.   Expected Discharge Date:  02/22/18               Expected Discharge Plan:  [place]  In-House Referral:     Discharge planning Services  CM Consult  Post Acute Care Choice:    Choice offered to:  Patient  DME Arranged:    DME Agency:     HH Arranged:  PT, OT, Nurse's Aide [place] Agency:  [place]  Status of Service:  Completed, signed off  If discussed at [place] of Stay Meetings, dates discussed:    Additional Comments:  Dessa Phi, RN 02/22/2018, 3:59 PM

## 2018-02-22 NOTE — Discharge Summary (Signed)
Physician Discharge Summary  Morgan Harrell VFI:[id number] DOB: Jun 15, 1924 DOA: 02/19/2018  PCP: System, Pcp Not In  Admit date: 02/19/2018 Discharge date: 02/22/2018  Admitted From: Home Disposition: Home (with daughter)   Recommendations for Outpatient Follow-up:  1. Follow up with PCP/establish care in [place] in 1-2 weeks 2. Please obtain BMP/CBC in one week  Home Health: PT, OT, aide Equipment/Devices: 3 in 1 Discharge Condition: Stable, improved CODE STATUS: Full Diet recommendation: Heart healthy  Brief/Interim Summary: Morgan Harrell is a 85 y.o. female with a history of stage IV CKD, HTN, HLD, remote colon CA, knee OA, and GERD who presented to the ED for progressive shortness of breath, leg swelling in the setting of influenza recently diagnosed at urgent care. She has been driving back and forth from home in [place], [place] to [place], [place] to be with her daughter since her husband's death (at the age of 80yrs) in Nov 2019. In the ED troponin was elevated to 0.83, creatinine 1.84 with cardiomegaly and vascular congestion on CXR. D-dimer grossly elevated, though CTA was not ordered due to renal impairment. V/Q scan was high probability for PE and heparin infusion was started. This was ultimately converted to [place] and with supportive care, pulmonary exam normalized and dyspnea improved.   Discharge Diagnoses:  Principal Problem:   Acute respiratory failure with hypoxia (HCC) Active Problems:   GERD (gastroesophageal reflux disease)   HLD (hyperlipidemia)   HTN (hypertension)   Hypothyroidism   Influenza   Acute CHF (congestive heart failure) (HCC)   CKD (chronic kidney disease)   Elevated troponin   Pulmonary embolism (HCC)  Acute hypoxic respiratory failure: Due to PE with right heart strain in setting of influenza. No hx obstructive lung disease. Resolved.   PE: U/S negative for DVT. Echo w/severely reduced RV systolic function, RVE, RAE. - D/w pharmacy, will  transition to eliquis 10mg  po BID x7 days, then 5mg  po BID due to weight and renal function despite age. Will DC aspirin due to anticoagulation.   Troponin elevation: To be expected demand ischemia in 85yo with severe right heart demand due to PE with pulmonary HTN.  - Appreciate cardiology evaluation, has signed off with no further recommendations.   Influenza: Still grossly abnormal pulmonary exam.  - Received dose of xofluza - Continue BDs prn  HLD:  - Continue statin  HTN:  - prn Hydralazine  Hypothyroidism:  - Continue stable dose synthroid  Stage IV CKD: Based on available creatinine, back to baseline. Remains a candidate for eliquis.  - Ok to restart ARB  Discharge Instructions Discharge Instructions    Diet - low sodium heart healthy   Complete by:  As directed    Discharge instructions   Complete by:  As directed    - You were admitted with blood clots in the lungs and the flu which caused respiratory failure. This has improved enough to be discharged with the following recommendations:  - Continue taking blood thinner (eliquis) twice daily. For the first week, take 10mg  twice daily, then start taking the usual dose of 5mg  twice daily. A 30 day supply is printed and provided at discharge.  - Home health PT, OT, and aide will be arranged prior to discharge.  - If you experience worsening shortness of breath or chest pain, please seek medical attention right away. Otherwise, establish care with a PCP in the next 2 weeks.   Increase activity slowly   Complete by:  As directed      Allergies as  of 02/22/2018      Reactions   Codeine Nausea And Vomiting      Medication List    TAKE these medications   albuterol 108 (90 Base) MCG/ACT inhaler Commonly known as:  PROVENTIL HFA;VENTOLIN HFA Inhale 2 puffs into the lungs every 4 (four) hours as needed for wheezing or shortness of breath.   apixaban 5 MG Tabs tablet Commonly known as:  ELIQUIS Take 2 tablets (10 mg  total) by mouth 2 (two) times daily for 7 days, THEN 1 tablet (5 mg total) 2 (two) times daily for 23 days. Start taking on:  February 22, 2018   budesonide-formoterol 160-4.5 MCG/ACT inhaler Commonly known as:  SYMBICORT Inhale 2 puffs into the lungs 2 (two) times daily.   fluticasone 50 MCG/ACT nasal spray Commonly known as:  FLONASE Place 2 sprays into both nostrils daily.   furosemide 40 MG tablet Commonly known as:  LASIX Take 40 mg by mouth daily.   levothyroxine 50 MCG tablet Commonly known as:  SYNTHROID, LEVOTHROID Take 50 mcg by mouth daily before breakfast.   LIVALO 4 MG Tabs Generic drug:  Pitavastatin Calcium Take 4 mg by mouth daily.   loratadine 10 MG tablet Commonly known as:  CLARITIN Take 10 mg by mouth daily.   losartan 100 MG tablet Commonly known as:  COZAAR Take 100 mg by mouth daily.   meclizine 25 MG tablet Commonly known as:  ANTIVERT Take 25 mg by mouth daily as needed for dizziness (vertigo).   ranitidine 150 MG tablet Commonly known as:  ZANTAC Take 150 mg by mouth daily.            Durable Medical Equipment  (From admission, onward)         Start     Ordered   02/22/18 1458  DME 3-in-1  Once     02/22/18 1501         Follow-up Information    Primary Care Provider. Schedule an appointment as soon as possible for a visit in 2 week(s).          Allergies  Allergen Reactions  . Codeine Nausea And Vomiting    Consultations:  Pharmacy  Cardiology, Dr. Acie Fredrickson  Procedures/Studies: Dg Chest 2 View  Result Date: 02/19/2018 CLINICAL DATA:  85 year old female with shortness of breath. EXAM: CHEST - 2 VIEW COMPARISON:  None. FINDINGS: There is cardiomegaly with probable mild vascular congestion. No edema. No focal consolidation or pneumothorax. There is slight flattening of the diaphragms which may represent areas of scarring. Trace pleural effusions are less likely but not excluded. Clinical correlation is recommended. There  is atherosclerotic calcification of the aortic arch. No acute osseous pathology. IMPRESSION: Cardiomegaly with mild vascular congestion. No edema or focal consolidation. Electronically Signed   By: Anner Crete M.D.   On: 02/19/2018 01:57   Nm Pulmonary Perf And Vent  Addendum Date: 02/19/2018   ADDENDUM REPORT: 02/19/2018 10:44 ADDENDUM: These results were called by telephone at the time of interpretation on 02/19/2018 at 10:44 am to Dr. Kyung Bacca, covering hospitalist , who verbally acknowledged these results. Electronically Signed   By: Lowella Grip III M.D.   On: 02/19/2018 10:44   Result Date: 02/19/2018 CLINICAL DATA:  Shortness of Breath EXAM: NUCLEAR MEDICINE VENTILATION - PERFUSION LUNG SCAN VIEWS: Anterior, posterior, left lateral, right lateral, RPO, LPO, RAO, LAO-ventilation and perfusion RADIOPHARMACEUTICALS:  31.0 mCi of Tc-48m DTPA aerosol inhalation and 4.2 mCi Tc37m MAA IV COMPARISON:  Chest radiograph February 19, 2018 FINDINGS: Ventilation: Radiotracer uptake is homogeneous and symmetric bilaterally. No appreciable ventilation defects. Perfusion: There is absence of perfusion in the apical segment of the left upper lobe. There is also photopenia in the superior segment of the left lower lobe. There is also a perfusion defect in the anterior segment of the right upper lobe. IMPRESSION: There are apparent segmental perfusion defects that associated ventilation lesions. This study is felt to constitute a high probability of pulmonary embolus based on PIOPED II criteria. Electronically Signed: By: Lowella Grip III M.D. On: 02/19/2018 10:30   Vas [place] Lower Extremity Venous (dvt)  Result Date: 02/19/2018  Lower Venous Study Indications: Positive D-dimer.  Limitations: Imagery deficit; patient discomfort. Comparison Study: No comparison study available. Performing Technologist: Rudell Cobb  Examination Guidelines: A complete evaluation includes B-mode imaging, spectral Doppler, color  Doppler, and power Doppler as needed of all accessible portions of each vessel. Bilateral testing is considered an integral part of a complete examination. Limited examinations for reoccurring indications may be performed as noted.  Right Venous Findings: +---------+---------------+---------+-----------+----------+-------------------+          CompressibilityPhasicitySpontaneityPropertiesSummary             +---------+---------------+---------+-----------+----------+-------------------+ CFV      Full           Yes      Yes                                      +---------+---------------+---------+-----------+----------+-------------------+ SFJ      Full                                                             +---------+---------------+---------+-----------+----------+-------------------+ FV Prox  Full                                                             +---------+---------------+---------+-----------+----------+-------------------+ FV Mid   Full                                                             +---------+---------------+---------+-----------+----------+-------------------+ FV Distal               Yes      Yes                  difficult to                                                              perform compression  due to patient has                                                        severe pain         +---------+---------------+---------+-----------+----------+-------------------+ PFV      Full                                                             +---------+---------------+---------+-----------+----------+-------------------+ POP      Full           Yes      Yes                                      +---------+---------------+---------+-----------+----------+-------------------+ PTV      Full                                                              +---------+---------------+---------+-----------+----------+-------------------+ PERO                                                  Not visualized      +---------+---------------+---------+-----------+----------+-------------------+  Left Venous Findings: +---------+---------------+---------+-----------+----------+--------------+          CompressibilityPhasicitySpontaneityPropertiesSummary        +---------+---------------+---------+-----------+----------+--------------+ CFV      Full           Yes      Yes                                 +---------+---------------+---------+-----------+----------+--------------+ SFJ      Full                                                        +---------+---------------+---------+-----------+----------+--------------+ FV Prox  Full                                                        +---------+---------------+---------+-----------+----------+--------------+ FV Mid   Full                                                        +---------+---------------+---------+-----------+----------+--------------+ FV DistalFull                                                        +---------+---------------+---------+-----------+----------+--------------+  PFV      Full                                                        +---------+---------------+---------+-----------+----------+--------------+ POP      Full           Yes      Yes                                 +---------+---------------+---------+-----------+----------+--------------+ PTV      Full                                                        +---------+---------------+---------+-----------+----------+--------------+ PERO                                                  Not visualized +---------+---------------+---------+-----------+----------+--------------+    Summary: Right: There is no evidence of deep vein thrombosis in the lower  extremity. However, portions of this examination were limited- see technologist comments above. No cystic structure found in the popliteal fossa. Left: There is no evidence of deep vein thrombosis in the lower extremity. However, portions of this examination were limited- see technologist comments above. No cystic structure found in the popliteal fossa.  *See table(s) above for measurements and observations. Electronically signed by Servando Snare MD on 02/19/2018 at 12:04:40 PM.    Final    Echocardiogram 02/19/2018:  - Left ventricle: The cavity size was normal. Wall thickness was   normal. Systolic function was normal. The estimated ejection   fraction was in the range of 55% to 60%. Wall motion was normal;   there were no regional wall motion abnormalities. The study is   not technically sufficient to allow evaluation of LV diastolic   function. - Ventricular septum: The contour showed diastolic flattening and   systolic flattening. - Right ventricle: The cavity size was mildly dilated. Systolic   function was severely reduced. - Right atrium: The atrium was mildly dilated. - Pericardium, extracardiac: A trivial pericardial effusion was   identified.  Impressions: - Normal LV systolic function; D shaped septum consistent with   pulmonary hypertension; mild RAE and RVE; severe RV dysfunction.  Subjective: Breathing much better this morning. Got up to the bathroom without oxygen and mild dyspnea (improved). No chest pain or fever. Eating well. No bleeding.   Discharge Exam: Vitals:   02/22/18 0603 02/22/18 1300  BP: 122/66 127/62  Pulse: 61 60  Resp: 18 20  Temp: 98.8 F (37.1 C) 98.8 F (37.1 C)  SpO2: 99% 95%   General: Pt is alert, awake, not in acute distress Cardiovascular: RRR, S1/S2 +, no rubs, no gallops Respiratory: Nonlabored on room air, no wheezing on today's exam. Abdominal: Soft, NT, ND, bowel sounds + Extremities: No edema, no cyanosis  Labs: BNP (last 3  results) Recent Labs    02/19/18 0320  BNP 812.7*   Basic Metabolic Panel: Recent Labs  Lab 02/19/18 0120  02/20/18 0237 02/20/18 0325 02/21/18 0817 02/22/18 0531  NA 138 139 141 140 138  K 4.5 4.1 4.5 4.4 4.3  CL 109 112* 113* 112* 111  CO2 19* 18* 20* 18* 19*  GLUCOSE 140* 105* 107* 98 97  BUN 56* 46* 45* 40* 41*  CREATININE 1.84* 1.43* 1.47* 1.40* 1.37*  CALCIUM 9.0 8.5* 8.6* 8.9 8.3*   Liver Function Tests: Recent Labs  Lab 02/19/18 0120  AST 54*  ALT 38  ALKPHOS 70  BILITOT 0.8  PROT 7.3  ALBUMIN 3.9   CBC: Recent Labs  Lab 02/19/18 0120 02/20/18 0237 02/20/18 0325 02/21/18 0817 02/22/18 0531  WBC 7.8 6.8 6.7 5.8 6.7  NEUTROABS 6.4 4.7  --   --   --   HGB 11.8* 10.5* 10.4* 11.3* 9.9*  HCT 38.2 34.0* 34.3* 37.3 32.8*  MCV 91.4 93.2 92.2 92.8 93.7  PLT 203 177 182 199 183   Cardiac Enzymes: Recent Labs  Lab 02/19/18 0320 02/19/18 1725  TROPONINI 0.75* 0.87*    Time coordinating discharge: Approximately 40 minutes  Patrecia Pour, MD  [facility] 02/22/2018, 3:01 PM Pager [phone]

## 2018-02-22 NOTE — Progress Notes (Addendum)
ANTICOAGULATION CONSULT NOTE - follow up  Pharmacy Consult for IV heparin > eliquis Indication: r/o PE - high probability of PE per VQ scan  Allergies  Allergen Reactions  . Codeine Nausea And Vomiting    Patient Measurements: Height: 5\' 6"  (167.6 cm) Weight: 172 lb 2.9 oz (78.1 kg) IBW/kg (Calculated) : 59.3 Heparin Dosing Weight: 65 kg  Vital Signs: Temp: 98.8 F (37.1 C) (01/27 0603) Temp Source: Oral (01/27 0603) BP: 122/66 (01/27 0603) Pulse Rate: 61 (01/27 0603)  Labs: Recent Labs    02/19/18 1725  02/20/18 0325  02/21/18 0817 02/21/18 1626 02/22/18 0531  HGB  --    < > 10.4*  --  11.3*  --  9.9*  HCT  --    < > 34.3*  --  37.3  --  32.8*  PLT  --    < > 182  --  199  --  183  HEPARINUNFRC 0.56   < >  --    < > 0.47 0.32 0.26*  CREATININE  --    < > 1.47*  --  1.40*  --  1.37*  TROPONINI 0.87*  --   --   --   --   --   --    < > = values in this interval not displayed.    Estimated Creatinine Clearance: 28.2 mL/min (A) (by C-G formula based on SCr of 1.37 mg/dL (H)).   Medical History: Past Medical History:  Diagnosis Date  . GERD (gastroesophageal reflux disease)   . HLD (hyperlipidemia)   . HTN (hypertension)   . Hypothyroidism     Medications:  Scheduled:  . aspirin EC  81 mg Oral Daily  . famotidine  20 mg Oral QHS  . levothyroxine  50 mcg Oral QAC breakfast  . pravastatin  80 mg Oral q1800  . sodium chloride flush  3 mL Intravenous Q12H   Infusions:  . sodium chloride    . heparin      Assessment: 89 yoF with SOB, found to have elevated d-dimer. Ultrasound was negative for DVT. ECHO on 1/24 showed severe RV dysfunction. A V/Q scan was then performed on 1/24 and showed a high probability of PE based on PIOPED II criteria. Patient was initiated on IV heparin for PE. Plan on discharge today. Will be discharged on apixaban treatment dose.  Baseline labs: H/H=11.8/38.2. Plts=203. aPtt = 23. INR=1.03  02/22/2018  Heparin level  subtherapeutic at 0.26 today on heparin IV 750 units/hour  Hgb slightly lower at 9.9 and Plt 183  No reported bleeding  Goal of Therapy:  Heparin level 0.3-0.7 units/ml Monitor platelets by anticoagulation protocol: Yes   Plan:  1) Discontinue heparin IV 2) Initiate apixaban 10 mg BID x 7 days then 5mg  BID with first dose at the time that the heparin drip is stopped 3) D/c today 4) Monitor for signs and symptoms of bleeding   Jonette Eva PharmD Candidate 02/22/2018 7:35 AM   I discussed / reviewed the pharmacy note by Jonette Eva and I agree with his findings and plans as documented.  Peggyann Juba, PharmD, BCPS Pager: [phone] 02/22/2018 11:45 AM

## 2018-02-22 NOTE — Discharge Instructions (Signed)
Information on my medicine - ELIQUIS (apixaban)  This medication education was reviewed with me or my healthcare representative as part of my discharge preparation.  The pharmacist that spoke with me during my hospital stay was:  Donnal Debar, Student-PharmD  Why was Eliquis prescribed for you? Eliquis was prescribed to treat blood clots that may have been found in the veins of your legs (deep vein thrombosis) or in your lungs (pulmonary embolism) and to reduce the risk of them occurring again.  What do You need to know about Eliquis ? The starting dose is 10 mg (two 5 mg tablets) taken TWICE daily for the FIRST SEVEN (7) DAYS, then on February 3rd,  the dose is reduced to ONE 5 mg tablet taken TWICE daily.  Eliquis may be taken with or without food.   Try to take the dose about the same time in the morning and in the evening. If you have difficulty swallowing the tablet whole please discuss with your pharmacist how to take the medication safely.  Take Eliquis exactly as prescribed and DO NOT stop taking Eliquis without talking to the doctor who prescribed the medication.  Stopping may increase your risk of developing a new blood clot.  Refill your prescription before you run out.  After discharge, you should have regular check-up appointments with your healthcare provider that is prescribing your Eliquis.    What do you do if you miss a dose? If a dose of ELIQUIS is not taken at the scheduled time, take it as soon as possible on the same day and twice-daily administration should be resumed. The dose should not be doubled to make up for a missed dose.  Important Safety Information A possible side effect of Eliquis is bleeding. You should call your healthcare provider right away if you experience any of the following: ? Bleeding from an injury or your nose that does not stop. ? Unusual colored urine (red or dark brown) or unusual colored stools (red or black). ? Unusual bruising for  unknown reasons. ? A serious fall or if you hit your head (even if there is no bleeding).  Some medicines may interact with Eliquis and might increase your risk of bleeding or clotting while on Eliquis. To help avoid this, consult your healthcare provider or pharmacist prior to using any new prescription or non-prescription medications, including herbals, vitamins, non-steroidal anti-inflammatory drugs (NSAIDs) and supplements.  This website has more information on Eliquis (apixaban): http://www.eliquis.com/eliquis/home

## 2018-02-22 NOTE — Care Management Important Message (Signed)
Important Message  Patient Details  Name: Morgan Harrell MRN: [id number] Date of Birth: May 27, 1924   Medicare Important Message Given:  Yes    Kerin Salen 02/22/2018, 1:02 [place] Message  Patient Details  Name: Morgan Harrell MRN: [id number] Date of Birth: 1924-07-27   Medicare Important Message Given:  Yes    Kerin Salen 02/22/2018, 1:02 PM

## 2018-02-22 NOTE — Progress Notes (Signed)
D/C instructions reviewed w/ pt and dtr. Both verbalize understanding and all questions answered. Pt d/c in w/c in stable condition in possession of d/c instructions, scripts, and all personal belongings. Pt d/c to dtr's car by NT.

## 2018-02-22 NOTE — Plan of Care (Signed)
  Problem: Education: Goal: Knowledge of General Education information will improve Description Including pain rating scale, medication(s)/side effects and non-pharmacologic comfort measures Outcome: Progressing   Problem: Health Behavior/Discharge Planning: Goal: Ability to manage health-related needs will improve Outcome: Progressing   Problem: Clinical Measurements: Goal: Ability to maintain clinical measurements within normal limits will improve Outcome: Progressing Note:  Able to wean off O2 this AM. Maintained O2 sat >91% on RA with ambulation.  Goal: Will remain free from infection Outcome: Progressing Goal: Diagnostic test results will improve Outcome: Progressing Goal: Respiratory complications will improve Outcome: Progressing Note:  DOE improving, O2 weaned off.  Goal: Cardiovascular complication will be avoided Outcome: Progressing   Problem: Safety: Goal: Ability to remain free from injury will improve Outcome: Progressing

## 2018-02-22 NOTE — Progress Notes (Signed)
Provided w/pcp listing-they will choose pcp once @ home but understand the importance of a pcp.

## 2018-02-22 NOTE — Progress Notes (Signed)
Occupational Therapy Treatment Patient Details Name: Morgan Harrell MRN: [id number] DOB: Jun 26, 1924 Today's Date: 02/22/2018    History of present illness 85 y.o. female with a hx of CKD, hypertension, hyperlipidemia, remote colon cancer 1996, arthritis, bilateral carpal tunnel, GERD and osteoarthritis of the knees and admitted for shortness of breath/congestive heart failure and elevated troponin (also thought to possibly have PE).   OT comments  pts plan is to DC home with daughter  Follow Up Recommendations  Home health OT;Supervision - Intermittent    Equipment Recommendations  3 in 1 bedside commode    Recommendations for Other Services      Precautions / Restrictions Precautions Precautions: Fall Precaution Comments: droplet, monitor sats Restrictions Weight Bearing Restrictions: No       Mobility Bed Mobility               General bed mobility comments: pt up in recliner on arrival  Transfers Overall transfer level: Needs assistance Equipment used: Rolling walker (2 wheeled) Transfers: Sit to/from Stand;Stand Pivot Transfers(walk to bathroom) Sit to Stand: Min guard Stand pivot transfers: Min guard       General transfer comment: verbal cues for safe technique    Balance Overall balance assessment: Needs assistance         Standing balance support: No upper extremity supported Standing balance-Leahy Scale: Fair Standing balance comment: fair static, poor dynamic                           ADL either performed or assessed with clinical judgement   ADL Overall ADL's : Needs assistance/impaired     Grooming: Standing;Supervision/safety   Upper Body Bathing: Set up;Sitting   Lower Body Bathing: Minimal assistance;Sit to/from stand;Cueing for compensatory techniques;Cueing for safety   Upper Body Dressing : Set up;Sitting   Lower Body Dressing: Minimal assistance;Sit to/from stand   Toilet Transfer: Min guard;RW;Regular  Toilet;Grab bars   Toileting- Water quality scientist and Hygiene: Supervision/safety;Cueing for safety       Functional mobility during ADLs: Min guard General ADL Comments: Family will A as needed     Vision Patient Visual Report: No change from baseline     Perception     Praxis      Cognition Arousal/Alertness: Awake/alert Behavior During Therapy: WFL for tasks assessed/performed Overall Cognitive Status: Within Functional Limits for tasks assessed                                          Exercises     Shoulder Instructions       General Comments      Pertinent Vitals/ Pain       Pain Assessment: No/denies pain  Home Living                                          Prior Functioning/Environment              Frequency  Min 2X/week        Progress Toward Goals  OT Goals(current goals can now be found in the care plan section)  Progress towards OT goals: Progressing toward goals     Plan Discharge plan remains appropriate    Co-evaluation  AM-PAC OT "6 Clicks" Daily Activity     Outcome Measure   Help from another person eating meals?: None Help from another person taking care of personal grooming?: None Help from another person toileting, which includes using toliet, bedpan, or urinal?: A Little Help from another person bathing (including washing, rinsing, drying)?: A Little Help from another person to put on and taking off regular upper body clothing?: None Help from another person to put on and taking off regular lower body clothing?: A Little 6 Click Score: 21    End of Session Equipment Utilized During Treatment: Rolling walker  OT Visit Diagnosis: Unsteadiness on feet (R26.81);Muscle weakness (generalized) (M62.81)   Activity Tolerance Patient tolerated treatment well   Patient Left in chair;with call bell/phone within reach;with chair alarm set   Nurse Communication  Mobility status        Time: 4709-2957 OT Time Calculation (min): 32 min  Charges: OT General Charges $OT Visit: 1 Visit OT Treatments $Self Care/Home Management : 23-37 mins  Kari Baars, [place] [phone] Office- [phone]      Akila Batta, Edwena Felty D 02/22/2018, 7:07 PM

## 2018-03-30 ENCOUNTER — Other Ambulatory Visit (HOSPITAL_BASED_OUTPATIENT_CLINIC_OR_DEPARTMENT_OTHER): Payer: Self-pay

## 2018-03-30 DIAGNOSIS — G4733 Obstructive sleep apnea (adult) (pediatric): Secondary | ICD-10-CM

## 2018-05-19 ENCOUNTER — Encounter (HOSPITAL_BASED_OUTPATIENT_CLINIC_OR_DEPARTMENT_OTHER): Payer: Medicare Other

## 2018-06-16 ENCOUNTER — Encounter (HOSPITAL_BASED_OUTPATIENT_CLINIC_OR_DEPARTMENT_OTHER): Payer: Medicare Other

## 2018-07-16 ENCOUNTER — Other Ambulatory Visit (HOSPITAL_COMMUNITY)
Admission: RE | Admit: 2018-07-16 | Discharge: 2018-07-16 | Disposition: A | Discharge status: Home / Self Care | Payer: Medicare Other | Source: Ambulatory Visit | Attending: Internal Medicine | Admitting: Internal Medicine

## 2018-07-16 DIAGNOSIS — Z1159 Encounter for screening for other viral diseases: Secondary | ICD-10-CM | POA: Diagnosis present

## 2018-07-16 LAB — SARS CORONAVIRUS 2 (TAT 6-24 HRS): SARS Coronavirus 2: NEGATIVE

## 2018-07-20 ENCOUNTER — Other Ambulatory Visit: Payer: Self-pay

## 2018-07-20 ENCOUNTER — Ambulatory Visit (HOSPITAL_BASED_OUTPATIENT_CLINIC_OR_DEPARTMENT_OTHER): Payer: Medicare Other | Attending: Internal Medicine | Admitting: Internal Medicine

## 2018-07-20 DIAGNOSIS — G4733 Obstructive sleep apnea (adult) (pediatric): Secondary | ICD-10-CM | POA: Diagnosis present

## 2018-07-24 DIAGNOSIS — G4733 Obstructive sleep apnea (adult) (pediatric): Secondary | ICD-10-CM | POA: Diagnosis not present

## 2018-07-24 NOTE — Procedures (Signed)
     Patient Name: Morgan Harrell, Popescu Date: 07/20/2018 Gender: Female D.O.B: 1924/07/30 Age (years): 58 Referring Provider: Clovia Cuff MD Height (inches): 66 Interpreting Physician: Baird Lyons MD, ABSM Weight (lbs): 172 RPSGT: Carolin Coy BMI: 28 MRN: [id number] Neck Size: 15.50  CLINICAL INFORMATION Sleep Study Type: NPSG Indication for sleep study: Hypertension Epworth Sleepiness Score: 4  SLEEP STUDY TECHNIQUE As per the AASM Manual for the Scoring of Sleep and Associated Events v2.3 (April 2016) with a hypopnea requiring 4% desaturations.  The channels recorded and monitored were frontal, central and occipital EEG, electrooculogram (EOG), submentalis EMG (chin), nasal and oral airflow, thoracic and abdominal wall motion, anterior tibialis EMG, snore microphone, electrocardiogram, and pulse oximetry.  MEDICATIONS Medications self-administered by patient taken the night of the study : none reported  SLEEP ARCHITECTURE The study was initiated at 10:34:35 PM and ended at 4:38:42 AM.  Sleep onset time was 51.0 minutes and the sleep efficiency was 39.8%%. The total sleep time was 145 minutes.  Stage REM latency was N/A minutes.  The patient spent 28.6%% of the night in stage N1 sleep, 71.4%% in stage N2 sleep, 0.0%% in stage N3 and 0% in REM.  Alpha intrusion was absent.  Supine sleep was 44.83%.  RESPIRATORY PARAMETERS The overall apnea/hypopnea index (AHI) was 4.6 per hour. There were 0 total apneas, including 0 obstructive, 0 central and 0 mixed apneas. There were 11 hypopneas and 34 RERAs.  The AHI during Stage REM sleep was N/A per hour.  AHI while supine was 4.6 per hour.  The mean oxygen saturation was 95.3%. The minimum SpO2 during sleep was 93.0%.  soft snoring was noted during this study.  CARDIAC DATA The 2 lead EKG demonstrated sinus rhythm. The mean heart rate was 62.2 beats per minute. Other EKG findings include: None.  LEG MOVEMENT DATA  The total PLMS were 0 with a resulting PLMS index of 0.0. Associated arousal with leg movement index was 0.0 .  IMPRESSIONS - No significant obstructive sleep apnea occurred during this study (AHI = 4.6/h). - No significant central sleep apnea occurred during this study (CAI = 0.0/h). - The patient had minimal or no oxygen desaturation during the study (Min O2 = 93.0%) - The patient snored with soft snoring volume. - No cardiac abnormalities were noted during this study. - Clinically significant periodic limb movements did not occur during sleep. No significant associated arousals. - Fragmented sleep with frequent nonspecific arousals and awakenings. Total sleep time 145 minutes.  DIAGNOSIS - Insomnia - Primary snoring  RECOMMENDATIONS - Manage for insomnia and snoring as clinically appropriate. - Sleep hygiene should be reviewed to assess factors that may improve sleep quality. - Weight management and regular exercise should be initiated or continued if appropriate.  [Electronically signed] 07/24/2018 12:01 PM  Baird Lyons MD, [place], American Board of Sleep Medicine   NPI: [id number]                        [place], [place] [facility]  ELECTRONICALLY SIGNED ON:  07/24/2018, 11:57 AM [place] PH: [phone]   FX: [phone] [place]

## 2018-09-09 ENCOUNTER — Emergency Department (HOSPITAL_COMMUNITY)
Admission: EM | Admit: 2018-09-09 | Discharge: 2018-09-09 | Disposition: A | Discharge status: Home / Self Care | Payer: Medicare Other | Attending: Emergency Medicine | Admitting: Emergency Medicine

## 2018-09-09 ENCOUNTER — Other Ambulatory Visit: Payer: Self-pay

## 2018-09-09 ENCOUNTER — Encounter (HOSPITAL_COMMUNITY): Payer: Self-pay

## 2018-09-09 ENCOUNTER — Emergency Department (HOSPITAL_BASED_OUTPATIENT_CLINIC_OR_DEPARTMENT_OTHER): Payer: Medicare Other

## 2018-09-09 ENCOUNTER — Emergency Department (HOSPITAL_COMMUNITY): Payer: Medicare Other

## 2018-09-09 DIAGNOSIS — I509 Heart failure, unspecified: Secondary | ICD-10-CM | POA: Insufficient documentation

## 2018-09-09 DIAGNOSIS — M79605 Pain in left leg: Secondary | ICD-10-CM | POA: Diagnosis present

## 2018-09-09 DIAGNOSIS — R52 Pain, unspecified: Secondary | ICD-10-CM | POA: Diagnosis not present

## 2018-09-09 DIAGNOSIS — N189 Chronic kidney disease, unspecified: Secondary | ICD-10-CM | POA: Diagnosis not present

## 2018-09-09 DIAGNOSIS — Z86718 Personal history of other venous thrombosis and embolism: Secondary | ICD-10-CM

## 2018-09-09 DIAGNOSIS — Z79899 Other long term (current) drug therapy: Secondary | ICD-10-CM | POA: Diagnosis not present

## 2018-09-09 DIAGNOSIS — I130 Hypertensive heart and chronic kidney disease with heart failure and stage 1 through stage 4 chronic kidney disease, or unspecified chronic kidney disease: Secondary | ICD-10-CM | POA: Insufficient documentation

## 2018-09-09 DIAGNOSIS — Z85038 Personal history of other malignant neoplasm of large intestine: Secondary | ICD-10-CM | POA: Insufficient documentation

## 2018-09-09 DIAGNOSIS — E039 Hypothyroidism, unspecified: Secondary | ICD-10-CM | POA: Insufficient documentation

## 2018-09-09 MED ORDER — ACETAMINOPHEN 325 MG PO TABS
650.0000 mg | ORAL_TABLET | Freq: Once | ORAL | Status: AC
  Administered 2018-09-09: 650 mg via ORAL
  Filled 2018-09-09: qty 2

## 2018-09-09 MED ORDER — LIDOCAINE 5 % EX PTCH
1.0000 | MEDICATED_PATCH | CUTANEOUS | Status: DC
  Administered 2018-09-09: 18:00:00 1 via TRANSDERMAL
  Filled 2018-09-09: qty 1

## 2018-09-09 NOTE — ED Triage Notes (Signed)
Per pt, Pt was recently seen by urgent care for pain in her left leg. Pt believes that she may have pulled her hamstring due to pain in the posterior portion of her leg following her getting into bed. Urgent care had difficulty finding pulses in leg and referred pt to ED over concerns of possible clot in leg.

## 2018-09-09 NOTE — Discharge Instructions (Signed)
Your work-up today is reassuring, no evidence of blood clot on ultrasound.  Your x-ray does show that you have arthritis throughout your knee which could be contributing to your pain but you could also certainly have a pulled muscle.  Continue using Tylenol and lidocaine patches and follow-up with your PCP or orthopedist for further evaluation.

## 2018-09-09 NOTE — Progress Notes (Signed)
Left lower extremity venous duplex completed. Refer to "CV Proc" under chart review to view preliminary results.  09/09/2018 6:07 PM Maudry Mayhew, MHA, RVT, RDCS, RDMS

## 2018-09-09 NOTE — ED Provider Notes (Signed)
[facility] DEPT Provider Note   CSN: [id number] Arrival date & time: 09/09/18  1651     History   Chief Complaint Chief Complaint  Patient presents with  . Leg Pain    Possible DVT, left leg    HPI Morgan Harrell is a 85 y.o. female.     Morgan Harrell is a 85 y.o. female with a history of hypertension, the emergency department from urgent care for evaluation of pain in her left leg.  Patient reports that she has had pain in the leg since Monday night, she feels like she may have pulled her hamstring because she has pain in the posterior portion of her leg that starts just above the knee and radiates into the calf.  Pain is worse with movement and palpation.  She has not taken anything to treat pain, went to urgent care today but they had trouble finding pulses in both of her lower extremities so sent her to the ED for further evaluation.  She denies any numbness or weakness, no discoloration.  She has not had any associated chest pain or shortness of breath.  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  No other aggravating or alleviating factors.     Past Medical History:  Diagnosis Date  . GERD (gastroesophageal reflux disease)   . HLD (hyperlipidemia)   . HTN (hypertension)   . Hypothyroidism     Patient Active Problem List   Diagnosis Date Noted  . OSA (obstructive sleep apnea) 07/20/2018  . Influenza 02/19/2018  . Acute CHF (congestive heart failure) ([place]) 02/19/2018  . CKD (chronic kidney disease) 02/19/2018  . Elevated troponin 02/19/2018  . Acute respiratory failure with hypoxia ([place]) 02/19/2018  . Pulmonary embolism ([place]) 02/19/2018  . GERD (gastroesophageal reflux disease)   . HLD (hyperlipidemia)   . HTN (hypertension)   . Hypothyroidism     Past Surgical History:  Procedure Laterality Date  . ABDOMINAL HYSTERECTOMY    . Colon cancer     1996     OB  History   No obstetric history on file.      Home Medications    Prior to Admission medications   Medication Sig Start Date End Date Taking? Authorizing Provider  albuterol (PROVENTIL HFA;VENTOLIN HFA) 108 (90 Base) MCG/ACT inhaler Inhale 2 puffs into the lungs every 4 (four) hours as needed for wheezing or shortness of breath.    [provider]  apixaban (ELIQUIS) 5 MG TABS tablet Take 2 tablets (10 mg total) by mouth 2 (two) times daily for 7 days, THEN 1 tablet (5 mg total) 2 (two) times daily for 23 days. 02/22/18 03/24/18  Patrecia Pour, MD  budesonide-formoterol (SYMBICORT) 160-4.5 MCG/ACT inhaler Inhale 2 puffs into the lungs 2 (two) times daily.    [provider]  fluticasone (FLONASE) 50 MCG/ACT nasal spray Place 2 sprays into both nostrils daily.    [provider]  furosemide (LASIX) 40 MG tablet Take 40 mg by mouth daily.    [provider]  levothyroxine (SYNTHROID, LEVOTHROID) 50 MCG tablet Take 50 mcg by mouth daily before breakfast.    [provider]  loratadine (CLARITIN) 10 MG tablet Take 10 mg by mouth daily.    [provider]  losartan (COZAAR) 100 MG tablet Take 100 mg by mouth daily.    [provider]  meclizine (ANTIVERT) 25 MG tablet Take 25 mg  by mouth daily as needed for dizziness (vertigo).    [provider]  Pitavastatin Calcium (LIVALO) 4 MG TABS Take 4 mg by mouth daily.    [provider]  ranitidine (ZANTAC) 150 MG tablet Take 150 mg by mouth daily.    [provider]    Family History Family History  Problem Relation Age of Onset  . Hypertension Sister   . CAD Sister        ?MI in her 47's  . Hypertension Brother   . Heart disease Brother        possibly CHF  . Hypertension Mother   . Stroke Mother   . Alzheimer's disease Mother   . Hypertension Father     Social History Social History   Tobacco Use  . Smoking status: Never Smoker  . Smokeless  tobacco: Never Used  Substance Use Topics  . Alcohol use: Not Currently    Frequency: Never  . Drug use: Never     Allergies   Codeine   Review of Systems Review of Systems  Constitutional: Negative for chills and fever.  Respiratory: Negative for cough and shortness of breath.   Cardiovascular: Negative for chest pain.  Musculoskeletal: Positive for arthralgias and joint swelling.  Skin: Negative for color change, rash and wound.  Neurological: Negative for weakness and numbness.  All other systems reviewed and are negative.    Physical Exam Updated Vital Signs BP (!) 151/93   Pulse 65   Temp 98.2 F (36.8 C)   Resp 18   Ht 5\' 6"  (1.676 m)   Wt 78 kg   SpO2 100%   BMI 27.76 kg/m   Physical Exam Vitals signs and nursing note reviewed.  Constitutional:      General: She is not in acute distress.    Appearance: Normal appearance. She is well-developed and normal weight. She is not ill-appearing or diaphoretic.  HENT:     Head: Normocephalic and atraumatic.  Eyes:     General:        Right eye: No discharge.        Left eye: No discharge.     Pupils: Pupils are equal, round, and reactive to light.  Neck:     Musculoskeletal: Neck supple.  Cardiovascular:     Rate and Rhythm: Normal rate and regular rhythm.     Pulses:          Dorsalis pedis pulses are detected w/ Doppler on the right side and detected w/ Doppler on the left side.     Heart sounds: Normal heart sounds.  Pulmonary:     Effort: Pulmonary effort is normal. No respiratory distress.     Breath sounds: Normal breath sounds. No wheezing or rales.     Comments: Respirations equal and unlabored, patient able to speak in full sentences, lungs clear to auscultation bilaterally Abdominal:     General: Bowel sounds are normal. There is no distension.     Palpations: Abdomen is soft. There is no mass.     Tenderness: There is no abdominal tenderness. There is no guarding.     Comments: Abdomen soft,  nondistended, nontender to palpation in all quadrants without guarding or peritoneal signs  Musculoskeletal:        General: No deformity.     Comments: Tenderness over the left posterior distal thigh that extends behind the knee and into the calf, no palpable edema, no redness, warmth or skin changes.  Leg is warm and  well perfused.  Distal pulses present and equal, confirmed with Doppler, 5/5 strength and normal sensation.  Skin:    General: Skin is warm and dry.     Capillary Refill: Capillary refill takes less than 2 seconds.  Neurological:     Mental Status: She is alert.     Coordination: Coordination normal.     Comments: Speech is clear, able to follow commands Moves extremities without ataxia, coordination intact  Psychiatric:        Mood and Affect: Mood normal.        Behavior: Behavior normal.      ED Treatments / Results  Labs (all labs ordered are listed, but only abnormal results are displayed) Labs Reviewed - No data to display  EKG None  Radiology Dg Knee Complete 4 Views Left  Result Date: 09/09/2018 CLINICAL DATA:  Left knee pain. EXAM: LEFT KNEE - COMPLETE 4+ VIEW COMPARISON:  None. FINDINGS: No fracture or dislocation. Tricompartmental osteoarthritis with peripheral spurring. Subchondral irregularity of the lateral tibiofemoral compartment. Tiny quadriceps tendon enthesophyte. Small joint effusion. There are vascular calcifications. IMPRESSION: Tricompartmental osteoarthritis without acute osseous abnormality. Small joint effusion. Electronically Signed   By: Keith Rake M.D.   On: 09/09/2018 19:27   Vas [place] Lower Extremity Venous (dvt) (mc And Wl 7a-7p)  Result Date: 09/09/2018  Lower Venous Study Indications: Pain, and remote history of DVT.  Limitations: Patient position. Performing Technologist: Maudry Mayhew MHA, RDMS, RVT, RDCS  Examination Guidelines: A complete evaluation includes B-mode imaging, spectral Doppler, color Doppler, and power  Doppler as needed of all accessible portions of each vessel. Bilateral testing is considered an integral part of a complete examination. Limited examinations for reoccurring indications may be performed as noted.  +-----+---------------+---------+-----------+----------+--------------+ RIGHTCompressibilityPhasicitySpontaneityPropertiesSummary        +-----+---------------+---------+-----------+----------+--------------+ CFV                                               Not visualized +-----+---------------+---------+-----------+----------+--------------+   +---------+---------------+---------+-----------+----------+-------+ LEFT     CompressibilityPhasicitySpontaneityPropertiesSummary +---------+---------------+---------+-----------+----------+-------+ CFV      Full           Yes      Yes                          +---------+---------------+---------+-----------+----------+-------+ SFJ      Full                                                 +---------+---------------+---------+-----------+----------+-------+ FV Prox  Full                                                 +---------+---------------+---------+-----------+----------+-------+ FV Mid   Full                                                 +---------+---------------+---------+-----------+----------+-------+ FV DistalFull                                                 +---------+---------------+---------+-----------+----------+-------+  PFV      Full                                                 +---------+---------------+---------+-----------+----------+-------+ POP      Full           Yes      Yes                          +---------+---------------+---------+-----------+----------+-------+ PTV      Full                                                 +---------+---------------+---------+-----------+----------+-------+ PERO     Full                                                  +---------+---------------+---------+-----------+----------+-------+     Summary: Left: There is no evidence of deep vein thrombosis in the lower extremity. No cystic structure found in the popliteal fossa.  *See table(s) above for measurements and observations.    Preliminary     Procedures Procedures (including critical care time)  Medications Ordered in ED Medications  lidocaine (LIDODERM) 5 % 1 patch (1 patch Transdermal Patch Applied 09/09/18 1818)  acetaminophen (TYLENOL) tablet 650 mg (650 mg Oral Given 09/09/18 1817)     Initial Impression / Assessment and Plan / ED Course  I have reviewed the triage vital signs and the nursing notes.  Pertinent labs & imaging results that were available during my care of the patient were reviewed by me and considered in my medical decision making (see chart for details).  85 year old female presents with pain in the posterior left thigh radiating behind the knee and into the proximal calf for the past 4 days, she went to urgent care but they had difficulty finding pulses so she was sent to the ED.  On my exam she has equal pulses confirmed with Doppler and both lower extremities are warm and well-perfused there is some tenderness over the muscles of the posterior thigh and tenderness behind the knee but no erythema, swelling or warmth.  Does have history of blood clots, will get DVT study and x-ray of the left knee.  Tylenol and lidocaine patch for symptoms.  DVT study is negative, x-ray shows tricompartmental arthritis with slight joint effusion but no other acute findings.  I suspect patient's pain may be related to arthritis versus a pulled muscle, will have patient treat with Tylenol, lidocaine patches and follow-up with her PCP or Ortho if not improving.  Return precautions discussed.  Patient expresses understanding and agreement with plan.  Discharged home in good condition.  Patient discussed with Dr. Laverta Baltimore, who saw patient as well and  agrees with plan.   Final Clinical Impressions(s) / ED Diagnoses   Final diagnoses:  Left leg pain    ED Discharge Orders    None       Janet Berlin 09/09/18 Hilarie Fredrickson, MD 09/10/18 2013

## 2019-11-08 ENCOUNTER — Ambulatory Visit: Payer: Medicare Other | Attending: Internal Medicine

## 2019-11-08 DIAGNOSIS — Z23 Encounter for immunization: Secondary | ICD-10-CM

## 2019-11-08 NOTE — Progress Notes (Signed)
   Covid-19 Vaccination Clinic  Name:  Euphemia Lingerfelt    MRN: [id number] DOB: 1924-01-29  11/08/2019  Ms. Blassingame was observed post Covid-19 immunization for 15 minutes without incident. She was provided with Vaccine Information Sheet and instruction to access the V-Safe system.   Ms. Marut was instructed to call [phone] with any severe reactions post vaccine: Marland Kitchen Difficulty breathing  . Swelling of face and throat  . A fast heartbeat  . A bad rash all over body  . Dizziness and weakness

## 2020-01-09 ENCOUNTER — Encounter: Payer: Self-pay | Admitting: Interventional Cardiology

## 2020-01-09 ENCOUNTER — Other Ambulatory Visit: Payer: Self-pay

## 2020-01-09 ENCOUNTER — Telehealth: Payer: Self-pay | Admitting: Interventional Cardiology

## 2020-01-09 ENCOUNTER — Ambulatory Visit (INDEPENDENT_AMBULATORY_CARE_PROVIDER_SITE_OTHER): Payer: Medicare Other | Admitting: Interventional Cardiology

## 2020-01-09 VITALS — BP 124/52 | HR 60 | Ht 66.0 in | Wt 168.0 lb

## 2020-01-09 DIAGNOSIS — I10 Essential (primary) hypertension: Secondary | ICD-10-CM

## 2020-01-09 DIAGNOSIS — R0609 Other forms of dyspnea: Secondary | ICD-10-CM

## 2020-01-09 DIAGNOSIS — I499 Cardiac arrhythmia, unspecified: Secondary | ICD-10-CM

## 2020-01-09 DIAGNOSIS — N1831 Chronic kidney disease, stage 3a: Secondary | ICD-10-CM

## 2020-01-09 DIAGNOSIS — R0600 Dyspnea, unspecified: Secondary | ICD-10-CM | POA: Diagnosis not present

## 2020-01-09 DIAGNOSIS — E782 Mixed hyperlipidemia: Secondary | ICD-10-CM

## 2020-01-09 DIAGNOSIS — Z7189 Other specified counseling: Secondary | ICD-10-CM

## 2020-01-09 DIAGNOSIS — R5383 Other fatigue: Secondary | ICD-10-CM

## 2020-01-09 DIAGNOSIS — I50811 Acute right heart failure: Secondary | ICD-10-CM

## 2020-01-09 DIAGNOSIS — R0602 Shortness of breath: Secondary | ICD-10-CM | POA: Diagnosis not present

## 2020-01-09 LAB — BASIC METABOLIC PANEL
BUN/Creatinine Ratio: 27 (ref 12–28)
BUN: 48 mg/dL — ABNORMAL HIGH (ref 10–36)
CO2: 21 mmol/L (ref 20–29)
Calcium: 9.4 mg/dL (ref 8.7–10.3)
Chloride: 105 mmol/L (ref 96–106)
Creatinine, Ser: 1.8 mg/dL — ABNORMAL HIGH (ref 0.57–1.00)
GFR calc Af Amer: 28 mL/min/{1.73_m2} — ABNORMAL LOW (ref >59)
GFR calc non Af Amer: 24 mL/min/{1.73_m2} — ABNORMAL LOW (ref >59)
Glucose: 93 mg/dL (ref 65–99)
Potassium: 4.6 mmol/L (ref 3.5–5.2)
Sodium: 141 mmol/L (ref 134–144)

## 2020-01-09 LAB — HEPATIC FUNCTION PANEL
ALT: 14 IU/L (ref 0–32)
AST: 18 IU/L (ref 0–40)
Albumin: 4.1 g/dL (ref 3.5–4.6)
Alkaline Phosphatase: 77 IU/L (ref 44–121)
Bilirubin Total: 0.3 mg/dL (ref 0.0–1.2)
Bilirubin, Direct: <0.1 mg/dL (ref 0.00–0.40)
Total Protein: 6.6 g/dL (ref 6.0–8.5)

## 2020-01-09 LAB — CBC
Hematocrit: 35.8 % (ref 34.0–46.6)
Hemoglobin: 11.6 g/dL (ref 11.1–15.9)
MCH: 28.4 pg (ref 26.6–33.0)
MCHC: 32.4 g/dL (ref 31.5–35.7)
MCV: 88 fL (ref 79–97)
Platelets: 225 10*3/uL (ref 150–450)
RBC: 4.09 x10E6/uL (ref 3.77–5.28)
RDW: 13.3 % (ref 11.7–15.4)
WBC: 6.7 10*3/uL (ref 3.4–10.8)

## 2020-01-09 LAB — PRO B NATRIURETIC PEPTIDE: NT-Pro BNP: 622 pg/mL (ref 0–738)

## 2020-01-09 NOTE — Patient Instructions (Signed)
Medication Instructions:  Your physician recommends that you continue on your current medications as directed. Please refer to the Current Medication list given to you today.  *If you need a refill on your cardiac medications before your next appointment, please call your pharmacy*   Lab Work: BMET, Liver, CBC and Pro BNP today  If you have labs (blood work) drawn today and your tests are completely normal, you will receive your results only by: Marland Kitchen MyChart Message (if you have MyChart) OR . A paper copy in the mail If you have any lab test that is abnormal or we need to change your treatment, we will call you to review the results.   Testing/Procedures: Your physician has requested that you have an echocardiogram. Echocardiography is a painless test that uses sound waves to create images of your heart. It provides your doctor with information about the size and shape of your heart and how well your heart's chambers and valves are working. This procedure takes approximately one hour. There are no restrictions for this procedure.   Follow-Up: At [facility], you and your health needs are our priority.  As part of our continuing mission to provide you with exceptional heart care, we have created designated Provider Care Teams.  These Care Teams include your primary Cardiologist (physician) and Advanced Practice Providers (APPs -  Physician Assistants and Nurse Practitioners) who all work together to provide you with the care you need, when you need it.  We recommend signing up for the patient portal called "MyChart".  Sign up information is provided on this After Visit Summary.  MyChart is used to connect with patients for Virtual Visits (Telemedicine).  Patients are able to view lab/test results, encounter notes, upcoming appointments, etc.  Non-urgent messages can be sent to your provider as well.   To learn more about what you can do with MyChart, go to [email or web address].    Your  next appointment:   As needed  The format for your next appointment:   In Person  Provider:   You may see [name], MD or one of the following Advanced Practice Providers on your designated Care Team:    Truitt Merle, NP  Cecilie Kicks, NP  Kathyrn Drown, NP    Other Instructions

## 2020-01-09 NOTE — Telephone Encounter (Signed)
Per Dr. Tamala Julian, pt needs to be seen today.  Pt added to schedule at 10AM.

## 2020-01-09 NOTE — Progress Notes (Signed)
Cardiology Office Note:    Date:  01/09/2020   ID:  Morgan Harrell, DOB [birth year], MRN [id number]  PCP:  Clovia Cuff, MD  Cardiologist:  [name], MD   Referring MD: No ref. provider found   Chief Complaint  Patient presents with  . Shortness of Breath  . Irregular Heart Beat  . Fatigue    History of Present Illness:    Morgan Harrell is a 86 y.o. female with a hx of recurrent pulmonary embolism (most recent January 2020), chronic apixaban therapy, CKD III, primary hypertension, hyperlipidemia, colon cancer 1996 (status post surgery), bilateral severe knee ostial arthritis, gastroesophageal reflux disease, and hypothyroidism.  Over the past 2 to 6 months the patient has noticed exertional fatigue and dyspnea on exertion.  Her daughter notes inability to perform activities that she could greater than 3 to 6 months ago.  When sitting she is having no difficulty.  With walking from the parking lot into a store, she has to stop and rest because of dyspnea and fatigue.  This is new.  She ambulates with difficulty because of severe bilateral osteoarthritis of her knees.  She denies orthopnea.  She has had chronic mild lower extremity swelling which has not changed.  She denies orthopnea.  There is no associated chest discomfort with physical activity.  She has not had syncope.  When supine at rest she can feel her heart beat and has palpitations.  When sitting, this is not a noticeable problem.  Of note, she has been observed to have an irregular heartbeat by radial palpation.  There is no prior history of atrial fibrillation.  No significant family history of heart disease.  Past Medical History:  Diagnosis Date  . GERD (gastroesophageal reflux disease)   . HLD (hyperlipidemia)   . HTN (hypertension)   . Hypothyroidism     Past Surgical History:  Procedure Laterality Date  . ABDOMINAL HYSTERECTOMY    . Colon cancer     1996    Current Medications: Current Meds   Medication Sig  . albuterol (PROVENTIL HFA;VENTOLIN HFA) 108 (90 Base) MCG/ACT inhaler Inhale 2 puffs into the lungs every 4 (four) hours as needed for wheezing or shortness of breath.  Marland Kitchen apixaban (ELIQUIS) 5 MG TABS tablet Take 2 tablets (10 mg total) by mouth 2 (two) times daily for 7 days, THEN 1 tablet (5 mg total) 2 (two) times daily for 23 days. (Patient taking differently: 2 (two) times daily)  . fluticasone (FLONASE) 50 MCG/ACT nasal spray Place 2 sprays into both nostrils daily.  . furosemide (LASIX) 40 MG tablet Take 40 mg by mouth daily.  Marland Kitchen levothyroxine (SYNTHROID, LEVOTHROID) 50 MCG tablet Take 50 mcg by mouth daily before breakfast.  . loratadine (CLARITIN) 10 MG tablet Take 10 mg by mouth daily.  Marland Kitchen losartan (COZAAR) 100 MG tablet Take 100 mg by mouth daily.  . meclizine (ANTIVERT) 25 MG tablet Take 25 mg by mouth daily as needed for dizziness (vertigo).  . Pitavastatin Calcium 4 MG TABS Take 4 mg by mouth daily.  . ranitidine (ZANTAC) 150 MG tablet Take 150 mg by mouth daily.  . [DISCONTINUED] budesonide-formoterol (SYMBICORT) 160-4.5 MCG/ACT inhaler Inhale 2 puffs into the lungs 2 (two) times daily.     Allergies:   Codeine   Social History   Socioeconomic History  . Marital status: Widowed    Spouse name: Not on file  . Number of children: Not on file  . Years of education: Not on  file  . Highest education level: Not on file  Occupational History  . Not on file  Tobacco Use  . Smoking status: Never Smoker  . Smokeless tobacco: Never Used  Substance and Sexual Activity  . Alcohol use: Not Currently  . Drug use: Never  . Sexual activity: Not on file  Other Topics Concern  . Not on file  Social History Narrative  . Not on file   Social Determinants of Health   Financial Resource Strain: Not on file  Food Insecurity: Not on file  Transportation Needs: Not on file  Physical Activity: Not on file  Stress: Not on file  Social Connections: Not on file      Family History: The patient's family history includes Alzheimer's disease in her mother; CAD in her sister; Heart disease in her brother; Hypertension in her brother, father, mother, and sister; Stroke in her mother.  ROS:   Please see the history of present illness.    During the pandemic and particularly over the last 6 months, opportunity for repetitive exertion as decreased.  Initially this was not related to fatigue or dyspnea but to pain with ambulation from arthritis.  All other systems reviewed and are negative.  EKGs/Labs/Other Studies Reviewed:    The following studies were reviewed today:  February 19, 2018 Echocardiogram: Study Conclusions   - Left ventricle: The cavity size was normal. Wall thickness was  normal. Systolic function was normal. The estimated ejection  fraction was in the range of 55% to 60%. Wall motion was normal;  there were no regional wall motion abnormalities. The study is  not technically sufficient to allow evaluation of LV diastolic  function.  - Ventricular septum: The contour showed diastolic flattening and  systolic flattening.  - Right ventricle: The cavity size was mildly dilated. Systolic  function was severely reduced.  - Right atrium: The atrium was mildly dilated.  - Pericardium, extracardiac: A trivial pericardial effusion was  identified.   Impressions:   - Normal LV systolic function; D shaped septum consistent with  pulmonary hypertension; mild RAE and RVE; severe RV dysfunction.    EKG:  EKG sinus rhythm, normal PR interval, incomplete right bundle, periods of ectopic atrial tachycardia versus atrial fibrillation.  No evidence of myocardial infarction or ischemia.  Recent Labs: No results found for requested labs within last 8760 hours.  Recent Lipid Panel    Component Value Date/Time   CHOL 119 02/19/2018 0320   TRIG 113 02/19/2018 0320   HDL 37 (L) 02/19/2018 0320   CHOLHDL 3.2 02/19/2018 0320   VLDL  23 02/19/2018 0320   LDLCALC 59 02/19/2018 0320    Physical Exam:    VS:  BP (!) 124/52   Pulse 60   Ht _0  (1.676 m)   Wt 168 lb (76.2 kg)   SpO2 96%   BMI 27.12 kg/m     Wt Readings from Last 3 Encounters:  01/09/20 168 lb (76.2 kg)  09/09/18 172 lb (78 kg)  07/20/18 172 lb (78 kg)     GEN: Appears younger than stated age. No acute distress.  She is examined with her close on.  We were able to remove her blouse for lung and cardiac auscultation. HEENT: Normal NECK: No JVD with the patient sitting at 90 degrees. LYMPHATICS: No lymphadenopathy CARDIAC: Irregular RR without murmur, gallop, or rub.  Trace bilateral right greater than left ankle edema. VASCULAR:  Normal Pulses. No bruits. RESPIRATORY:  Clear to auscultation without rales,  wheezing or rhonchi  ABDOMEN: Soft, non-tender, non-distended, No pulsatile mass, MUSCULOSKELETAL: No deformity  SKIN: Warm and dry NEUROLOGIC:  Alert and oriented x 3 PSYCHIATRIC:  Normal affect   ASSESSMENT:    1. DOE (dyspnea on exertion)   2. Other fatigue   3. Irregular heartbeat   4. Primary hypertension   5. SOB (shortness of breath)   6. Acute right-sided congestive heart failure ([place])   7. Mixed hyperlipidemia   8. Stage 3a chronic kidney disease ([place])   9. Educated about COVID-19 virus infection    PLAN:    In order of problems listed above:  1. There is no evidence of volume overload.  It is possible that dyspnea on exertion is related to diastolic heart failure.  Rule out physical deconditioning, continued impact of prior pulmonary emboli, or other poorly recognized causes which may be revealed by blood work-up and echocardiography. 2. Possibly related to physical deconditioning.  Rule out heart failure. 3. Initially, concern for persistent and possibly new atrial fibrillation.  EKG today demonstrated sinus rhythm with PACs/nonsustained ectopic atrial tachycardia.  No heart rates are extremely fast.  I therefore do not  believe her complaints are related to atrial fibrillation since she is in sinus rhythm today and experienced the index complaints getting into the building. 4. Target 140/80 mmHg.  Excellent current control on Cozaar 100 mg daily furosemide 40 mg daily. 5. Please see #1 above.  BMP will be obtained to rule out cardiac source of shortness of breath. 6. The echocardiogram from January 2020 was personally reviewed.  There was acute right heart enlargement/failure.  Her current symptoms could be related to chronic right heart failure.  2D Doppler echocardiogram will be done to assess for recovery of RV and to assess LV function. 7. Current management with Pitavastatin.  LDL was 59 in January 2020 8. Stage IIIa CKD based upon the most recent blood work from January 2020.  Creatinine will be reevaluated today. 9. Vaccinated, boosted, and practicing social mitigation.  Overall, I am suspicious that fatigue and dyspnea are related to deconditioning and aging.  We will rule out right and left heart failure with repeat 2D Doppler echo.  Since she is short of breath, a BNP will be done to exclude occult heart failure.  Hemoglobin, creatinine, and electrolyte disturbance will be ruled out with c-Met and CBC.  I do not believe that continuous monitoring is needed since we have demonstrated that she is not in sustained atrial fibrillation.  She is protected against embolic disease with apixaban should she have episodes of AF.   Medication Adjustments/Labs and Tests Ordered: Current medicines are reviewed at length with the patient today.  Concerns regarding medicines are outlined above.  Orders Placed This Encounter  Procedures  . CBC  . Pro b natriuretic peptide  . Basic metabolic panel  . Hepatic function panel  . EKG 12-Lead  . ECHOCARDIOGRAM COMPLETE   No orders of the defined types were placed in this encounter.   Patient Instructions  Medication Instructions:  Your physician recommends that you  continue on your current medications as directed. Please refer to the Current Medication list given to you today.  *If you need a refill on your cardiac medications before your next appointment, please call your pharmacy*   Lab Work: BMET, Liver, CBC and Pro BNP today  If you have labs (blood work) drawn today and your tests are completely normal, you will receive your results only by: Marland Kitchen MyChart Message (  if you have MyChart) OR . A paper copy in the mail If you have any lab test that is abnormal or we need to change your treatment, we will call you to review the results.   Testing/Procedures: Your physician has requested that you have an echocardiogram. Echocardiography is a painless test that uses sound waves to create images of your heart. It provides your doctor with information about the size and shape of your heart and how well your heart's chambers and valves are working. This procedure takes approximately one hour. There are no restrictions for this procedure.   Follow-Up: At [facility], you and your health needs are our priority.  As part of our continuing mission to provide you with exceptional heart care, we have created designated Provider Care Teams.  These Care Teams include your primary Cardiologist (physician) and Advanced Practice Providers (APPs -  Physician Assistants and Nurse Practitioners) who all work together to provide you with the care you need, when you need it.  We recommend signing up for the patient portal called "MyChart".  Sign up information is provided on this After Visit Summary.  MyChart is used to connect with patients for Virtual Visits (Telemedicine).  Patients are able to view lab/test results, encounter notes, upcoming appointments, etc.  Non-urgent messages can be sent to your provider as well.   To learn more about what you can do with MyChart, go to [email or web address].    Your next appointment:   As needed  The format for your next  appointment:   In Person  Provider:   You may see [name], MD or one of the following Advanced Practice Providers on your designated Care Team:    Truitt Merle, NP  Cecilie Kicks, NP  Kathyrn Drown, NP    Other Instructions      Signed, [name], MD  01/09/2020 1:49 PM    [place]

## 2020-01-10 ENCOUNTER — Telehealth: Payer: Self-pay | Admitting: *Deleted

## 2020-01-10 MED ORDER — FUROSEMIDE 20 MG PO TABS: 20.0000 mg | ORAL_TABLET | Freq: Every day | ORAL | 3 refills | Status: DC

## 2020-01-10 NOTE — Telephone Encounter (Signed)
-----   Message from Belva Crome, MD sent at 01/10/2020  9:03 AM EST ----- Let the patient know there is stage 4 CKD with Creat 1.8. Probably due to lasix. Decrease to 20 mg daily, weigh daily same circumstance each AM. Notify if increased SOB or swelling. Blood test suggests heart doing okay. Consider possibly that BP is too low for age. Decreasing furosemide may help. Awaiting echo. A copy will be sent to Clovia Cuff, MD

## 2020-01-10 NOTE — Telephone Encounter (Signed)
Spoke with daughter, DPR on file.  Made her aware of results and recommendations per Dr. Tamala Julian.  Daughter agreeable to plan.  Advised to call if any increased SOB or swelling.

## 2020-01-10 NOTE — Telephone Encounter (Signed)
Patient's daughter calling back. She states the patient would like to speak with the nurse about her results.

## 2020-01-10 NOTE — Telephone Encounter (Signed)
Spoke with pt and daughter about results and recommendations.  Pt very appreciative for information.

## 2020-01-31 DIAGNOSIS — R42 Dizziness and giddiness: Secondary | ICD-10-CM | POA: Insufficient documentation

## 2020-02-03 ENCOUNTER — Ambulatory Visit (HOSPITAL_COMMUNITY): Payer: Medicare Other | Attending: Cardiovascular Disease

## 2020-02-03 ENCOUNTER — Other Ambulatory Visit: Payer: Self-pay

## 2020-02-03 ENCOUNTER — Other Ambulatory Visit: Payer: Self-pay | Admitting: *Deleted

## 2020-02-03 DIAGNOSIS — R0602 Shortness of breath: Secondary | ICD-10-CM | POA: Insufficient documentation

## 2020-02-03 DIAGNOSIS — I50811 Acute right heart failure: Secondary | ICD-10-CM | POA: Diagnosis present

## 2020-02-03 LAB — ECHOCARDIOGRAM COMPLETE
Area-P 1/2: 3.46 cm2
S' Lateral: 2.5 cm

## 2020-02-15 ENCOUNTER — Telehealth: Payer: Self-pay | Admitting: Interventional Cardiology

## 2020-02-15 DIAGNOSIS — I50811 Acute right heart failure: Secondary | ICD-10-CM

## 2020-02-15 NOTE — Telephone Encounter (Signed)
Pt c/o swelling: STAT is pt has developed SOB within 24 hours  1) How much weight have you gained and in what time span?   2) If swelling, where is the swelling located? Feet and legs, mainly the L leg   3) Are you currently taking a fluid pill? no  4) Are you currently SOB? Yes. Pt is wheezing a lot  5) Do you have a log of your daily weights (if so, list)? No. PT does not weigh herself daily   6) Have you gained 3 pounds in a day or 5 pounds in a week?   7) Have you traveled recently? no  Patient also feels tired all the time.   Pt c/o Shortness Of Breath: STAT if SOB developed within the last 24 hours or pt is noticeably SOB on the phone  1. Are you currently SOB (can you hear that pt is SOB on the phone)? Pt says no, daughter disagrees   2. How long have you been experiencing SOB? About 2 weeks   3. Are you SOB when sitting or when up moving around? All the time   4. Are you currently experiencing any other symptoms? Tired, swelling in feet

## 2020-02-15 NOTE — Telephone Encounter (Signed)
Spoke with pt, daughter and son in [employer].  They all state that pt is having swelling in feet, ankles and calves.  Daughter states you can hear some wheezing when she's sitting down at times.  SOB worsens with exertion.  Denies increased salt. Denies CP, lightheadedness, dizziness or other cardiac issues. Weight on home scale this morning was 190.6.  Pt states she had on a lot of clothing and some pieces are thick.  Weight usually around 175.  Recent echo looked good so we advised pt to stop Furosemide.  Advised pt to go ahead and take Furosemide '20mg'$  now and I will send message to Dr. Tamala Julian for review and advisement.

## 2020-02-16 MED ORDER — FUROSEMIDE 20 MG PO TABS: 20.0000 mg | ORAL_TABLET | Freq: Every day | ORAL | 3 refills | Status: DC

## 2020-02-16 NOTE — Telephone Encounter (Signed)
Resume furosemide 20 mg every day. Give [place] f/u in 7 days relative to weight and breathing on daily furosemide. BMET in 2 weeks. Can be done by her mobile service or back here in the office.

## 2020-02-16 NOTE — Telephone Encounter (Signed)
Spoke with pt's son in law, [place] on file.  Made him aware of recommendations per Dr. Tamala Julian.  Pt will come for labs on 2/8.  He verbalized understanding and was in agreement with plan.

## 2020-03-06 ENCOUNTER — Other Ambulatory Visit: Payer: Self-pay

## 2020-03-06 ENCOUNTER — Other Ambulatory Visit: Payer: Medicare Other

## 2020-03-06 DIAGNOSIS — I50811 Acute right heart failure: Secondary | ICD-10-CM

## 2020-03-06 LAB — BASIC METABOLIC PANEL
BUN/Creatinine Ratio: 23 (ref 12–28)
BUN: 34 mg/dL (ref 10–36)
CO2: 19 mmol/L — ABNORMAL LOW (ref 20–29)
Calcium: 9.2 mg/dL (ref 8.7–10.3)
Chloride: 110 mmol/L — ABNORMAL HIGH (ref 96–106)
Creatinine, Ser: 1.46 mg/dL — ABNORMAL HIGH (ref 0.57–1.00)
GFR calc Af Amer: 36 mL/min/{1.73_m2} — ABNORMAL LOW (ref >59)
GFR calc non Af Amer: 31 mL/min/{1.73_m2} — ABNORMAL LOW (ref >59)
Glucose: 95 mg/dL (ref 65–99)
Potassium: 4.7 mmol/L (ref 3.5–5.2)
Sodium: 143 mmol/L (ref 134–144)

## 2020-05-17 ENCOUNTER — Telehealth: Payer: Self-pay | Admitting: Interventional Cardiology

## 2020-05-17 DIAGNOSIS — I50811 Acute right heart failure: Secondary | ICD-10-CM

## 2020-05-17 NOTE — Telephone Encounter (Signed)
   Pt c/o swelling: STAT is pt has developed SOB within 24 hours  1) How much weight have you gained and in what time span?   2) If swelling, where is the swelling located? Left leg and foot  3) Are you currently taking a fluid pill? Yes  4) Are you currently SOB? Yes, but doing ok  5) Do you have a log of your daily weights (if so, list)? no  6) Have you gained 3 pounds in a day or 5 pounds in a week? no  7) Have you traveled recently? no   Pt's daughter calling she said pt's left leg and foot is swollen for about a week now. She said it is not as bad as before but she still wanted to to Dr. Tamala Julian and if she can talk to his nurse to discuss it

## 2020-05-17 NOTE — Telephone Encounter (Signed)
Left message to call back  

## 2020-05-17 NOTE — Telephone Encounter (Signed)
She requesting that Dr. Thompson Caul nurse please try calling both numbers, [phone] and [phone], if unable to reach her at the primary number.

## 2020-05-17 NOTE — Telephone Encounter (Signed)
Spoke with daughter and pt has been having issues with swelling for about 10-14 days now.  Right leg has almost resolved but left foot and ankle still edematous.  Swelling improves slightly at night.  SOB is at baseline but daughter states there were a couple of days it was a little worse.  No weights available.  Daughter states she does eat out half of the time and salts her meat.  Currently taking Furosemide '20mg'$  QD.  Advised to take Furosemide'40mg'$  tomorrow morning (pt does not want to take additional dose tonight due to arthritis and having to get up to go to bathroom).  Also aware of the need to cut back on salt.  Advised I will send to Dr. Tamala Julian for review and further advisement.

## 2020-05-17 NOTE — Telephone Encounter (Signed)
Patient is returning call.  °

## 2020-05-20 NOTE — Telephone Encounter (Signed)
Used to be on 40 mg daily but got weak and dizzy. On 20 mg daily she is retaining fluid which is aggravated by variable salt intake. Perhaps alternating 40 mg daily and 20 mg daily will be best. Needs a BMET in 10 days after the change.

## 2020-05-21 MED ORDER — FUROSEMIDE 20 MG PO TABS: ORAL_TABLET | ORAL | 3 refills | Status: DC

## 2020-05-21 NOTE — Telephone Encounter (Signed)
Patient's daughter calling back. 

## 2020-05-21 NOTE — Telephone Encounter (Signed)
Spoke with daughter and made her aware of recommendations per Dr. Tamala Julian.  Daughter will bring pt for labs on 5/4.  States swelling is slightly better but pt concerned about taking increased doses of furosemide due to kidneys.

## 2020-05-21 NOTE — Telephone Encounter (Signed)
Daughter calling back to clarify instructions for Furosemide.  Advised to take '40mg'$  one day, alternating with '20mg'$ .  Daughter verbalized understanding.

## 2020-05-30 ENCOUNTER — Other Ambulatory Visit: Payer: Medicare Other | Admitting: *Deleted

## 2020-05-30 ENCOUNTER — Other Ambulatory Visit: Payer: Self-pay

## 2020-05-30 DIAGNOSIS — I50811 Acute right heart failure: Secondary | ICD-10-CM

## 2020-05-30 LAB — BASIC METABOLIC PANEL
BUN/Creatinine Ratio: 32 — ABNORMAL HIGH (ref 12–28)
BUN: 54 mg/dL — ABNORMAL HIGH (ref 10–36)
CO2: 24 mmol/L (ref 20–29)
Calcium: 9.7 mg/dL (ref 8.7–10.3)
Chloride: 107 mmol/L — ABNORMAL HIGH (ref 96–106)
Creatinine, Ser: 1.69 mg/dL — ABNORMAL HIGH (ref 0.57–1.00)
Glucose: 93 mg/dL (ref 65–99)
Potassium: 5 mmol/L (ref 3.5–5.2)
Sodium: 140 mmol/L (ref 134–144)
eGFR: 28 mL/min/{1.73_m2} — ABNORMAL LOW (ref >59)

## 2020-09-05 NOTE — Progress Notes (Signed)
Synopsis: Referred for dyspnea by Clovia Cuff, MD  Subjective:   PATIENT ID: Morgan Harrell GENDER: female DOB: 1924-08-26, MRN: [id number]  Chief Complaint  Patient presents with   Consult    Consult for Dyspnea. Reports SOB with ambulation and exertion. Denies cough.    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   The patient says she's developed some shortness of breath and wheezing over the last year. Winded after 1 flight of stairs. The only time she think she has cough is if she inadvertently aspirates water when she's drinking it. No CP. No orthopnea. She does think her legs have gotten more swollen over the last year. Her weight may be up about 20 pounds since last year.   She thinks the albuterol inhaler may be helpful.  Otherwise pertinent review of systems is negative.  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 Has always lived in [place] and [place].   No family history of lung disease.   Past Medical History:  Diagnosis Date   GERD (gastroesophageal reflux disease)    HLD (hyperlipidemia)    HTN (hypertension)    Hypothyroidism      Family History  Problem Relation Age of Onset   Hypertension Sister    CAD Sister        ?MI in her 20's   Hypertension Brother    Heart disease Brother        possibly CHF   Hypertension Mother    Stroke Mother    Alzheimer's disease Mother    Hypertension Father      Past Surgical History:  Procedure Laterality Date   ABDOMINAL HYSTERECTOMY     Colon cancer     1996    Social History   Socioeconomic History   Marital status: Widowed    Spouse name: Not on file   Number of children: Not on file   Years of education: Not on file   Highest education level: Not on file  Occupational History   Not on file  Tobacco Use   Smoking status: Never   Smokeless tobacco: Never  Substance and Sexual Activity   Alcohol use: Not Currently    Drug use: Never   Sexual activity: Not on file  Other Topics Concern   Not on file  Social History Narrative   Not on file   Social Determinants of Health   Financial Resource Strain: Not on file  Food Insecurity: Not on file  Transportation Needs: Not on file  Physical Activity: Not on file  Stress: Not on file  Social Connections: Not on file  Intimate Partner Violence: Not on file     Allergies  Allergen Reactions   Codeine Nausea And Vomiting     Outpatient Medications Prior to Visit  Medication Sig Dispense Refill   albuterol (PROVENTIL HFA;VENTOLIN HFA) 108 (90 Base) MCG/ACT inhaler Inhale 2 puffs into the lungs every 4 (four) hours as needed for wheezing or shortness of breath.     apixaban (ELIQUIS) 5 MG TABS tablet Take 2 tablets (10 mg total) by mouth 2 (two) times daily for 7 days, THEN 1 tablet (5 mg total) 2 (two) times daily for 23 days. (Patient taking differently: 2 (two) times daily) 74 tablet 0   fluticasone (FLONASE) 50 MCG/ACT nasal spray Place 2 sprays into both nostrils daily.     furosemide (LASIX) 20 MG tablet Take 2 tablets by  mouth once daily every other day, alternating with '20mg'$  once daily on the other days. 135 tablet 3   levothyroxine (SYNTHROID, LEVOTHROID) 50 MCG tablet Take 50 mcg by mouth daily before breakfast.     loratadine (CLARITIN) 10 MG tablet Take 10 mg by mouth daily.     losartan (COZAAR) 100 MG tablet Take 100 mg by mouth daily.     meclizine (ANTIVERT) 25 MG tablet Take 25 mg by mouth daily as needed for dizziness (vertigo).     metoprolol tartrate (LOPRESSOR) 25 MG tablet Take 25 mg by mouth 2 (two) times daily.     Pitavastatin Calcium 4 MG TABS Take 4 mg by mouth daily.     ranitidine (ZANTAC) 150 MG tablet Take 150 mg by mouth daily.     triamcinolone (KENALOG) 0.025 % ointment triamcinolone acetonide 0.025 % topical ointment  APPLY THIN LAYER TOPICALLY TO THE AFFECTED AREA TWICE DAILY     No facility-administered medications  prior to visit.       Objective:   Physical Exam:  General appearance: 87 y.o., female, NAD, conversant  Eyes: anicteric sclerae, moist conjunctivae; no lid-lag; PERRL, tracking appropriately HENT: NCAT; oropharynx, MMM, no mucosal ulcerations; normal hard and soft palate Neck: Trachea midline; no lymphadenopathy, no JVD Lungs: crackles in bases, faint wheeze over inferior R lung fields, with normal respiratory effort CV: RRR, no MRGs  Abdomen: Soft, non-tender; non-distended, BS present  Extremities: +BLE edema L>R, radial and DP pulses present bilaterally  Skin: Normal temperature, turgor and texture; no rash Psych: Appropriate affect Neuro: Alert and oriented to person and place, no focal deficit    Vitals:   09/06/20 1457  BP: 138/60  Pulse: 69  Temp: 98.6 F (37 C)  TempSrc: Oral  SpO2: 99%  Weight: 190 lb 12.8 oz (86.5 kg)  Height: '5\' 7"'$  (1.702 m)   99% on RA BMI Readings from Last 3 Encounters:  09/06/20 29.88 kg/m  01/09/20 27.12 kg/m  09/09/18 27.76 kg/m   Wt Readings from Last 3 Encounters:  09/06/20 190 lb 12.8 oz (86.5 kg)  01/09/20 168 lb (76.2 kg)  09/09/18 172 lb (78 kg)     CBC    Component Value Date/Time   WBC 6.7 01/09/2020 1119   WBC 6.7 02/22/2018 0531   RBC 4.09 01/09/2020 1119   RBC 3.50 (L) 02/22/2018 0531   HGB 11.6 01/09/2020 1119   HCT 35.8 01/09/2020 1119   PLT 225 01/09/2020 1119   MCV 88 01/09/2020 1119   MCH 28.4 01/09/2020 1119   MCH 28.3 02/22/2018 0531   MCHC 32.4 01/09/2020 1119   MCHC 30.2 02/22/2018 0531   RDW 13.3 01/09/2020 1119   LYMPHSABS 1.4 02/20/2018 0237   MONOABS 0.6 02/20/2018 0237   EOSABS 0.1 02/20/2018 0237   BASOSABS 0.0 02/20/2018 0237      Chest Imaging: CXR 02/19/18 reviewed by me and remarkable for enlarged cardiac silhouette  V/Q Scan 02/19/18: There are apparent segmental perfusion defects that associated ventilation lesions. This study is felt to constitute a high probability of  pulmonary embolus based on PIOPED II criteria.  Pulmonary Functions Testing Results: No flowsheet data found.  Split night sleep study 07/20/18:  IMPRESSIONS - No significant obstructive sleep apnea occurred during this study (AHI = 4.6/h). - No significant central sleep apnea occurred during this study (CAI = 0.0/h). - The patient had minimal or no oxygen desaturation during the study (Min O2 = 93.0%) - The patient snored with soft snoring  volume. - No cardiac abnormalities were noted during this study. - Clinically significant periodic limb movements did not occur during sleep. No significant associated arousals. - Fragmented sleep with frequent nonspecific arousals and awakenings. Total sleep time 145 minutes.    Echocardiogram:  TTE 02/03/20: 1. Left ventricular ejection fraction, by estimation, is 60 to 65%. The  left ventricle has normal function. The left ventricle has no regional  wall motion abnormalities. Left ventricular diastolic parameters are  indeterminate. Elevated left ventricular  end-diastolic pressure.   2. Right ventricular systolic function is normal. The right ventricular  size is normal. There is normal pulmonary artery systolic pressure.   3. Right atrial size was mildly dilated.   4. The mitral valve is normal in structure. Trivial mitral valve  regurgitation. No evidence of mitral stenosis.   5. The aortic valve is tricuspid. There is mild calcification of the  aortic valve. There is mild thickening of the aortic valve. Aortic valve  regurgitation is not visualized. No aortic stenosis is present.   6. The inferior vena cava is normal in size with greater than 50%  respiratory variability, suggesting right atrial pressure of 3 mmHg.   TTE 02/19/18: - Normal LV systolic function; D shaped septum consistent with    pulmonary hypertension; mild RAE and RVE; severe RV dysfunction.   Heart Catheterization: None    Assessment & Plan:   # Dyspnea on  exertion: broad differential. While she may have some deconditioning, weight gain and BLE edema are suggestive that component of volume overload from diastolic dysfunction may be playing role. She does have crackles on exam which could be related to volume overload or less likely an undiscovered ILD (although etiology would be unclear from history).    Plan: - PFTs, CBC, TSH - CXR     Maryjane Hurter, MD [facility] 09/06/2020 3:29 PM

## 2020-09-06 ENCOUNTER — Encounter: Payer: Self-pay | Admitting: Student

## 2020-09-06 ENCOUNTER — Other Ambulatory Visit: Payer: Self-pay

## 2020-09-06 ENCOUNTER — Ambulatory Visit (INDEPENDENT_AMBULATORY_CARE_PROVIDER_SITE_OTHER): Payer: Medicare Other

## 2020-09-06 ENCOUNTER — Ambulatory Visit (INDEPENDENT_AMBULATORY_CARE_PROVIDER_SITE_OTHER): Payer: Medicare Other | Admitting: Student

## 2020-09-06 VITALS — BP 138/60 | HR 69 | Temp 98.6°F | Ht 67.0 in | Wt 190.8 lb

## 2020-09-06 DIAGNOSIS — R0609 Other forms of dyspnea: Secondary | ICD-10-CM

## 2020-09-06 DIAGNOSIS — I2699 Other pulmonary embolism without acute cor pulmonale: Secondary | ICD-10-CM | POA: Diagnosis not present

## 2020-09-06 DIAGNOSIS — R0600 Dyspnea, unspecified: Secondary | ICD-10-CM

## 2020-09-06 NOTE — Addendum Note (Signed)
Addended by: Suzzanne Cloud E on: 09/06/2020 03:37 PM   Modules accepted: Orders

## 2020-09-06 NOTE — Patient Instructions (Addendum)
-   Chest x-ray today - Labs today to check for a couple common causes of shortness of breath - We will schedule breathing tests this month and I will call with results to discuss next steps - See you in 6 months or sooner if need be - call [phone] if you end up needing a sooner appointment

## 2020-09-07 ENCOUNTER — Telehealth: Payer: Self-pay | Admitting: Student

## 2020-09-07 LAB — BASIC METABOLIC PANEL
BUN: 61 mg/dL — ABNORMAL HIGH (ref 6–23)
CO2: 24 mEq/L (ref 19–32)
Calcium: 9.4 mg/dL (ref 8.4–10.5)
Chloride: 107 mEq/L (ref 96–112)
Creatinine, Ser: 1.58 mg/dL — ABNORMAL HIGH (ref 0.40–1.20)
GFR: 27.86 mL/min — ABNORMAL LOW (ref >60.00)
Glucose, Bld: 95 mg/dL (ref 70–99)
Potassium: 4.6 mEq/L (ref 3.5–5.1)
Sodium: 141 mEq/L (ref 135–145)

## 2020-09-07 LAB — CBC WITH DIFFERENTIAL/PLATELET
Basophils Absolute: 0 10*3/uL (ref 0.0–0.1)
Basophils Relative: 0.8 % (ref 0.0–3.0)
Eosinophils Absolute: 0.2 10*3/uL (ref 0.0–0.7)
Eosinophils Relative: 2.9 % (ref 0.0–5.0)
HCT: 36 % (ref 36.0–46.0)
Hemoglobin: 11.6 g/dL — ABNORMAL LOW (ref 12.0–15.0)
Lymphocytes Relative: 18.6 % (ref 12.0–46.0)
Lymphs Abs: 1.1 10*3/uL (ref 0.7–4.0)
MCHC: 32.3 g/dL (ref 30.0–36.0)
MCV: 88 fl (ref 78.0–100.0)
Monocytes Absolute: 0.4 10*3/uL (ref 0.1–1.0)
Monocytes Relative: 5.9 % (ref 3.0–12.0)
Neutro Abs: 4.2 10*3/uL (ref 1.4–7.7)
Neutrophils Relative %: 71.8 % (ref 43.0–77.0)
Platelets: 210 10*3/uL (ref 150.0–400.0)
RBC: 4.09 Mil/uL (ref 3.87–5.11)
RDW: 14.7 % (ref 11.5–15.5)
WBC: 5.9 10*3/uL (ref 4.0–10.5)

## 2020-09-07 LAB — TSH: TSH: 2.28 u[IU]/mL (ref 0.35–5.50)

## 2020-09-07 NOTE — Telephone Encounter (Signed)
Called and discussed results with daughter, all questions addressed.

## 2020-09-07 NOTE — Telephone Encounter (Signed)
Called and discussed results with daughter, all questions addressed. Nothing here that in and of itself explains worsening dyspnea, we'll await PFT results.

## 2020-12-07 ENCOUNTER — Telehealth: Payer: Self-pay | Admitting: Interventional Cardiology

## 2020-12-07 NOTE — Telephone Encounter (Signed)
Patient's daughter called stating her mother is feeling very tired,and with no energy lately.  She would like for her to be seen soon.

## 2020-12-07 NOTE — Telephone Encounter (Signed)
Patient's daughter returned call.

## 2020-12-07 NOTE — Telephone Encounter (Signed)
Lm to call back ./cy 

## 2020-12-07 NOTE — Telephone Encounter (Signed)
Returned call to Pt's daughter.  Per daughter, pt is "tired all the time".  States decreased energy started 1-2 months ago.  She does have some lower extremity edema.  Deny SOB or chest pain.  State Pt saw PCP and they started her on vitamins but they do not feel like this is helping.  Offered Pt appt on Monday November 14 with APP.  Pt does not want to see APP.  Advised did not have anything else available soon.    Pt and daughter request to be put on wait list to see Dr. Tamala Julian.  Pt on wait list.  She will call with worsening symptoms.

## 2020-12-12 ENCOUNTER — Encounter: Payer: Self-pay | Admitting: Interventional Cardiology

## 2020-12-12 ENCOUNTER — Other Ambulatory Visit: Payer: Self-pay

## 2020-12-12 ENCOUNTER — Ambulatory Visit (INDEPENDENT_AMBULATORY_CARE_PROVIDER_SITE_OTHER): Payer: Medicare Other | Admitting: Interventional Cardiology

## 2020-12-12 VITALS — BP 122/88 | HR 60 | Ht 67.0 in | Wt 185.4 lb

## 2020-12-12 DIAGNOSIS — I10 Essential (primary) hypertension: Secondary | ICD-10-CM

## 2020-12-12 DIAGNOSIS — R5383 Other fatigue: Secondary | ICD-10-CM

## 2020-12-12 DIAGNOSIS — I499 Cardiac arrhythmia, unspecified: Secondary | ICD-10-CM | POA: Diagnosis not present

## 2020-12-12 DIAGNOSIS — R0609 Other forms of dyspnea: Secondary | ICD-10-CM | POA: Diagnosis not present

## 2020-12-12 DIAGNOSIS — N1831 Chronic kidney disease, stage 3a: Secondary | ICD-10-CM

## 2020-12-12 DIAGNOSIS — E782 Mixed hyperlipidemia: Secondary | ICD-10-CM

## 2020-12-12 DIAGNOSIS — I50811 Acute right heart failure: Secondary | ICD-10-CM

## 2020-12-12 NOTE — Progress Notes (Signed)
Cardiology Office Note:    Date:  12/12/2020   ID:  Morgan Harrell, DOB 1924/05/08, MRN [id number]  PCP:  Clovia Cuff, MD  Cardiologist:  [name], MD   Referring MD: Clovia Cuff, MD   No chief complaint on file.   History of Present Illness:    Morgan Harrell is a 87 y.o. female with a hx of recurrent pulmonary embolism (most recent January 2020), chronic apixaban therapy, CKD III, primary hypertension, hyperlipidemia, colon cancer 1996 (status post surgery), bilateral severe knee ostial arthritis, gastroesophageal reflux disease, and hypothyroidism.   Fatigue, dyspnea, and decreasing exertional tolerance.  Shortness of breath is the major issue.  She has some mild lower extremity swelling.  Past Medical History:  Diagnosis Date   GERD (gastroesophageal reflux disease)    HLD (hyperlipidemia)    HTN (hypertension)    Hypothyroidism     Past Surgical History:  Procedure Laterality Date   ABDOMINAL HYSTERECTOMY     Colon cancer     1996    Current Medications: Current Meds  Medication Sig   albuterol (PROVENTIL HFA;VENTOLIN HFA) 108 (90 Base) MCG/ACT inhaler Inhale 2 puffs into the lungs every 4 (four) hours as needed for wheezing or shortness of breath.   diclofenac Sodium (VOLTAREN) 1 % GEL diclofenac 1 % topical gel   fluticasone (FLONASE) 50 MCG/ACT nasal spray Place 2 sprays into both nostrils daily.   furosemide (LASIX) 20 MG tablet Take 2 tablets by mouth once daily every other day, alternating with 59m once daily on the other days.   ketorolac (ACULAR) 0.5 % ophthalmic solution as needed.   levothyroxine (SYNTHROID, LEVOTHROID) 50 MCG tablet Take 50 mcg by mouth daily before breakfast.   loratadine (CLARITIN) 10 MG tablet Take 10 mg by mouth daily.   losartan (COZAAR) 100 MG tablet Take 100 mg by mouth daily.   meclizine (ANTIVERT) 25 MG tablet Take 25 mg by mouth daily as needed for dizziness (vertigo).   metoprolol tartrate (LOPRESSOR) 25 MG  tablet Take 25 mg by mouth 2 (two) times daily.   Pitavastatin Calcium 4 MG TABS Take 4 mg by mouth daily.   ranitidine (ZANTAC) 150 MG tablet Take 150 mg by mouth daily.   triamcinolone (KENALOG) 0.025 % ointment triamcinolone acetonide 0.025 % topical ointment  APPLY THIN LAYER TOPICALLY TO THE AFFECTED AREA TWICE DAILY     Allergies:   Codeine   Social History   Socioeconomic History   Marital status: Widowed    Spouse name: Not on file   Number of children: Not on file   Years of education: Not on file   Highest education level: Not on file  Occupational History   Not on file  Tobacco Use   Smoking status: Never   Smokeless tobacco: Never  Substance and Sexual Activity   Alcohol use: Not Currently   Drug use: Never   Sexual activity: Not on file  Other Topics Concern   Not on file  Social History Narrative   Not on file   Social Determinants of Health   Financial Resource Strain: Not on file  Food Insecurity: Not on file  Transportation Needs: Not on file  Physical Activity: Not on file  Stress: Not on file  Social Connections: Not on file     Family History: The patient's family history includes Alzheimer's disease in her mother; CAD in her sister; Heart disease in her brother; Hypertension in her brother, father, mother, and sister; Stroke in her  mother.  ROS:   Please see the history of present illness.    There is some degree of depression and sadness because of dwindling exertional tolerance, she still misses her husband, and she is homesick as she has moved to [place] rather than living in [place] independently.  All other systems reviewed and are negative.  EKGs/Labs/Other Studies Reviewed:    The following studies were reviewed today: No new data  EKG:  EKG normal sinus rhythm/ectopic atrial rhythm, incomplete right bundle, left axis, PACs, and when compared to 02/15/2018, no significant changes noted other than a slightly slower  heart rate as noted on this exam  Recent Labs: 01/09/2020: ALT 14; NT-Pro BNP 622 09/06/2020: BUN 61; Creatinine, Ser 1.58; Hemoglobin 11.6; Platelets 210.0; Potassium 4.6; Sodium 141; TSH 2.28  Recent Lipid Panel    Component Value Date/Time   CHOL 119 02/19/2018 0320   TRIG 113 02/19/2018 0320   HDL 37 (L) 02/19/2018 0320   CHOLHDL 3.2 02/19/2018 0320   VLDL 23 02/19/2018 0320   LDLCALC 59 02/19/2018 0320    Physical Exam:    VS:  BP 122/88   Pulse 60   Ht 5' 7"  (1.702 m)   Wt 185 lb 6.4 oz (84.1 kg)   SpO2 97%   BMI 29.04 kg/m     Wt Readings from Last 3 Encounters:  12/12/20 185 lb 6.4 oz (84.1 kg)  09/06/20 190 lb 12.8 oz (86.5 kg)  01/09/20 168 lb (76.2 kg)     GEN: Well nourished, sitting in a wheelchair, appearing somewhat younger than stated age.. No acute distress HEENT: Normal NECK: No JVD. LYMPHATICS: No lymphadenopathy CARDIAC: No murmur. RRR no gallop, with trace bilateral ankle edema. VASCULAR:  Normal Pulses. No bruits. RESPIRATORY:  Clear to auscultation without rales, wheezing or rhonchi  ABDOMEN: Soft, non-tender, non-distended, No pulsatile mass, MUSCULOSKELETAL: No deformity  SKIN: Warm and dry NEUROLOGIC:  Alert and oriented x 3 PSYCHIATRIC:  Normal affect   ASSESSMENT:    1. DOE (dyspnea on exertion)   2. Acute right-sided congestive heart failure ([place])   3. Irregular heartbeat   4. Primary hypertension   5. Mixed hyperlipidemia   6. Stage 3a chronic kidney disease (HCC)    PLAN:    In order of problems listed above:  Dyspnea and fatigue, likely multifactorial.  There is probably a component of diastolic heart failure.  Check BNP.  If elevated, start low-dose Farxiga, 5 mg/day and consider up titrating to 10 mg.  If BNP is not elevated, continue current therapy.  Much of what she complains of could be related to deconditioning because she is reluctant to move around too much because of severe knee discomfort. No evidence of right heart  failure on exam Present and related to PACs. Excellent control on current regimen Continue statin. Stable function based on August data but we will also check a be met today.  Will start SGLT2 therapy if elevated brain natruretic peptide.  As needed follow-up.   Medication Adjustments/Labs and Tests Ordered: Current medicines are reviewed at length with the patient today.  Concerns regarding medicines are outlined above.  No orders of the defined types were placed in this encounter.  No orders of the defined types were placed in this encounter.   There are no Patient Instructions on file for this visit.   Signed, [name], MD  12/12/2020 12:45 PM    [place]

## 2020-12-12 NOTE — Patient Instructions (Signed)
Medication Instructions:  Your physician recommends that you continue on your current medications as directed. Please refer to the Current Medication list given to you today.  *If you need a refill on your cardiac medications before your next appointment, please call your pharmacy*   Lab Work: TODAY: BNP  If you have labs (blood work) drawn today and your tests are completely normal, you will receive your results only by: [place] (if you have MyChart) OR A paper copy in the mail If you have any lab test that is abnormal or we need to change your treatment, we will call you to review the results.  Follow-Up: At [facility], you and your health needs are our priority.  As part of our continuing mission to provide you with exceptional heart care, we have created designated Provider Care Teams.  These Care Teams include your primary Cardiologist (physician) and Advanced Practice Providers (APPs -  Physician Assistants and Nurse Practitioners) who all work together to provide you with the care you need, when you need it.  We recommend signing up for the patient portal called "MyChart".  Sign up information is provided on this After Visit Summary.  MyChart is used to connect with patients for Virtual Visits (Telemedicine).  Patients are able to view lab/test results, encounter notes, upcoming appointments, etc.  Non-urgent messages can be sent to your provider as well.   To learn more about what you can do with MyChart, go to [email or web address].    Your next appointment:   Follow up as needed

## 2020-12-13 LAB — BASIC METABOLIC PANEL
BUN/Creatinine Ratio: 31 — ABNORMAL HIGH (ref 12–28)
BUN: 53 mg/dL — ABNORMAL HIGH (ref 10–36)
CO2: 22 mmol/L (ref 20–29)
Calcium: 9.2 mg/dL (ref 8.7–10.3)
Chloride: 108 mmol/L — ABNORMAL HIGH (ref 96–106)
Creatinine, Ser: 1.7 mg/dL — ABNORMAL HIGH (ref 0.57–1.00)
Glucose: 92 mg/dL (ref 70–99)
Potassium: 5.7 mmol/L — ABNORMAL HIGH (ref 3.5–5.2)
Sodium: 142 mmol/L (ref 134–144)
eGFR: 28 mL/min/{1.73_m2} — ABNORMAL LOW (ref >59)

## 2020-12-13 LAB — PRO B NATRIURETIC PEPTIDE: NT-Pro BNP: 854 pg/mL — ABNORMAL HIGH (ref 0–738)

## 2020-12-18 NOTE — Addendum Note (Signed)
Addended by: Danielle Dess on: 12/18/2020 10:22 AM   Modules accepted: Orders

## 2020-12-26 ENCOUNTER — Telehealth: Payer: Self-pay | Admitting: *Deleted

## 2020-12-26 DIAGNOSIS — I50811 Acute right heart failure: Secondary | ICD-10-CM

## 2020-12-26 MED ORDER — DAPAGLIFLOZIN PROPANEDIOL 5 MG PO TABS: 5.0000 mg | ORAL_TABLET | Freq: Every day | ORAL | 11 refills | Status: DC

## 2020-12-26 MED ORDER — LOSARTAN POTASSIUM 50 MG PO TABS: 50.0000 mg | ORAL_TABLET | Freq: Every day | ORAL | 3 refills | Status: DC

## 2020-12-26 NOTE — Telephone Encounter (Signed)
Spoke with daughter and reviewed results and recommendations from Dr. Tamala Julian.  Pt will come for labs on 01/07/21.  Advised daughter to call with any issues on this new medication.  Morgan Harrell verbalized understanding and was in agreement with plan.

## 2020-12-26 NOTE — Telephone Encounter (Signed)
-----   Message from Belva Crome, MD sent at 12/13/2020  2:58 PM EST ----- Let the patient know the potassium is high. Low potassium diet. Stop Losartan for 4 days, then resume 50 mg daily rather than 100 mg daily. BMET will need to be repeated and start Farxiga 5 mg daily. Continue lasix in alternating fashion 20/40 mg  A copy will be sent to Clovia Cuff, MD

## 2020-12-27 ENCOUNTER — Telehealth: Payer: Self-pay | Admitting: Interventional Cardiology

## 2020-12-27 NOTE — Telephone Encounter (Signed)
I spoke with patient and her daughter. They have 10 mg farxiga samples.  Patient and daughter aware patient should take half tablet daily before breakfast  I also went over instructions from yesterday's note regarding Losartan with patient and her daughter.  They are aware of lab work on 12/12

## 2020-12-27 NOTE — Telephone Encounter (Signed)
Called pt daughter Kendrick Fries)  in regards to medication list.  Daughter expresses that NP from PCP office added multivitamin for women she does not know the dose and Vit B-12 500 mcg on 11/09/20.  I have added meds to pt med list as taking  Daughter requesting 2 copies of 12/12/20 OV with Dr. Tamala Julian.  I have printed 2 copies to be left a front desk for daughter to pick.

## 2020-12-27 NOTE — Telephone Encounter (Signed)
Daughter of the patient called. The daughter would like a list of all the medications Dr. Tamala Julian prescribes for her as well as written instructions for how to take those medications.  She would like to come in to pick that up as soon as possible

## 2020-12-27 NOTE — Telephone Encounter (Signed)
Patient called wanting to know how she is suppose to take the new medication (farxiga 10mg ) that was started today.

## 2020-12-27 NOTE — Telephone Encounter (Signed)
This afternoon Morgan Harrell daughter Kendrick Fries came in to pick up samples that were left for her mother. Before she left she wanted me to  let you know her mom is complaining of chapped and swollen lips and wanted advice about it. Also mentioned her mom is now taking Vitamin B12 and a multivitamin for women. She asked for a callback at [phone].

## 2020-12-27 NOTE — Telephone Encounter (Signed)
I spoke with patient and her daughter. Lips have been swollen and chapped for a couple of weeks.  Happened prior to starting any new medications.  Patient will follow up with PCP if this continues.  Patient was recently started on Vitamin B12 and multivitamin by PCP.  I told her it should be fine to continue these.

## 2020-12-31 ENCOUNTER — Other Ambulatory Visit: Payer: Medicare Other

## 2021-01-07 ENCOUNTER — Other Ambulatory Visit: Payer: Medicare Other

## 2021-01-07 ENCOUNTER — Other Ambulatory Visit: Payer: Self-pay

## 2021-01-07 ENCOUNTER — Other Ambulatory Visit: Payer: Medicare Other | Admitting: *Deleted

## 2021-01-07 DIAGNOSIS — I50811 Acute right heart failure: Secondary | ICD-10-CM

## 2021-01-08 LAB — BASIC METABOLIC PANEL
BUN/Creatinine Ratio: 22 (ref 12–28)
BUN: 33 mg/dL (ref 10–36)
CO2: 20 mmol/L (ref 20–29)
Calcium: 9.1 mg/dL (ref 8.7–10.3)
Chloride: 109 mmol/L — ABNORMAL HIGH (ref 96–106)
Creatinine, Ser: 1.49 mg/dL — ABNORMAL HIGH (ref 0.57–1.00)
Glucose: 89 mg/dL (ref 70–99)
Potassium: 4.7 mmol/L (ref 3.5–5.2)
Sodium: 144 mmol/L (ref 134–144)
eGFR: 32 mL/min/{1.73_m2} — ABNORMAL LOW (ref >59)

## 2021-01-23 ENCOUNTER — Telehealth: Payer: Self-pay | Admitting: Interventional Cardiology

## 2021-01-23 NOTE — Telephone Encounter (Signed)
Spoke with daughter and discussed pt's recent lab results and reviewed medications.  Daughter appreciative for call.

## 2021-01-23 NOTE — Telephone Encounter (Signed)
Pts daughter is reaching out with questions in regards to the pts medications... would like to know what should she be stopping and or continuing to take... please advise

## 2021-01-25 NOTE — Telephone Encounter (Signed)
Spoke with daughter and made her aware that pt is to continue her Wilder Glade and the medication was sent to the pharmacy.  Daughter will pick up the medication.

## 2021-01-25 NOTE — Telephone Encounter (Signed)
Patient's daughter calling back. She states the patient is out of samples and wants to know if she is supposed to continue the medication.

## 2021-01-29 NOTE — Telephone Encounter (Signed)
Called daughter and left message that medication was called into preferred pharmacy in [address]  Advised if she needs it sent to a local [facility] pharmacy, please call back and let me know which one.

## 2021-01-29 NOTE — Telephone Encounter (Signed)
Pt's daughter is returning call from Friday. She wants to know when the medicine Wilder Glade is going to be called into the pharmacy

## 2021-01-30 ENCOUNTER — Other Ambulatory Visit: Payer: Self-pay | Admitting: *Deleted

## 2021-01-30 ENCOUNTER — Telehealth: Payer: Self-pay | Admitting: Interventional Cardiology

## 2021-01-30 MED ORDER — DAPAGLIFLOZIN PROPANEDIOL 5 MG PO TABS: 5.0000 mg | ORAL_TABLET | Freq: Every day | ORAL | 11 refills | Status: DC

## 2021-01-30 NOTE — Telephone Encounter (Signed)
Daughter called back and requested that Morgan Harrell be sent to local [facility] on [address].  Medication sent.

## 2021-01-30 NOTE — Telephone Encounter (Signed)
New Message:     Daughter called and said in the last week patient lips have been swelling and burning. She said she did not know what was causing it. I asked was it the patient's medicine, she said she did not know.

## 2021-01-30 NOTE — Telephone Encounter (Signed)
Spoke with daughter and she states pt's lips have been swelling and burning for a little over a week.  Unsure if it started any sooner or shortly after starting Farxiga, which is the newest medication.  Denies any breathing issues.  Advised daughter to contact PCP and have her evaluated.  Daughter agreeable to plan.

## 2021-03-11 NOTE — Progress Notes (Signed)
Synopsis: Referred for dyspnea by Clovia Cuff, MD  Subjective:   PATIENT ID: Morgan Harrell GENDER: female DOB: [birth year], MRN: [id number]  Chief Complaint  Patient presents with   Follow-up    Breathing some better. She states she only gets winded walking up stairs.    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   The patient says she's developed some shortness of breath and wheezing over the last year. Winded after 1 flight of stairs. The only time she think she has cough is if she inadvertently aspirates water when she's drinking it. No CP. No orthopnea. She does think her legs have gotten more swollen over the last year. Her weight may be up about 20 pounds since last year.   She thinks the albuterol inhaler may be helpful.  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 Has always lived in [place] and [place].   No family history of lung disease.   Interval HPI: Restarted on lasix, losartan held in setting of hyperkalemia.  She says she thinks she's doing ok right now. Walks with a cane most of the time but if significant distance then uses wheelchair.   Otherwise pertinent review of systems is negative.  Past Medical History:  Diagnosis Date   GERD (gastroesophageal reflux disease)    HLD (hyperlipidemia)    HTN (hypertension)    Hypothyroidism      Family History  Problem Relation Age of Onset   Hypertension Sister    CAD Sister        ?MI in her 4's   Hypertension Brother    Heart disease Brother        possibly CHF   Hypertension Mother    Stroke Mother    Alzheimer's disease Mother    Hypertension Father      Past Surgical History:  Procedure Laterality Date   ABDOMINAL HYSTERECTOMY     Colon cancer     1996    Social History   Socioeconomic History   Marital status: Widowed    Spouse name: Not on file   Number of children: Not on file   Years of education: Not on file    Highest education level: Not on file  Occupational History   Not on file  Tobacco Use   Smoking status: Never   Smokeless tobacco: Never  Substance and Sexual Activity   Alcohol use: Not Currently   Drug use: Never   Sexual activity: Not on file  Other Topics Concern   Not on file  Social History Narrative   Not on file   Social Determinants of Health   Financial Resource Strain: Not on file  Food Insecurity: Not on file  Transportation Needs: Not on file  Physical Activity: Not on file  Stress: Not on file  Social Connections: Not on file  Intimate Partner Violence: Not on file     Allergies  Allergen Reactions   Codeine Nausea And Vomiting     Outpatient Medications Prior to Visit  Medication Sig Dispense Refill   albuterol (PROVENTIL HFA;VENTOLIN HFA) 108 (90 Base) MCG/ACT inhaler Inhale 2 puffs into the lungs every 4 (four) hours as needed for wheezing or shortness of breath.     apixaban (ELIQUIS) 5 MG TABS tablet Take 5 mg by mouth 2 (two) times daily.     Cyanocobalamin (VITAMIN B-12 PO) Take by mouth.     dapagliflozin propanediol (FARXIGA)  5 MG TABS tablet Take 1 tablet (5 mg total) by mouth daily before breakfast. 30 tablet 11   diclofenac Sodium (VOLTAREN) 1 % GEL diclofenac 1 % topical gel     fluticasone (FLONASE) 50 MCG/ACT nasal spray Place 2 sprays into both nostrils daily.     furosemide (LASIX) 20 MG tablet Take 2 tablets by mouth once daily every other day, alternating with 20mg  once daily on the other days. 135 tablet 3   ketorolac (ACULAR) 0.5 % ophthalmic solution as needed.     levothyroxine (SYNTHROID, LEVOTHROID) 50 MCG tablet Take 50 mcg by mouth daily before breakfast.     loratadine (CLARITIN) 10 MG tablet Take 10 mg by mouth daily.     losartan (COZAAR) 50 MG tablet Take 1 tablet (50 mg total) by mouth daily. 90 tablet 3   meclizine (ANTIVERT) 25 MG tablet Take 25 mg by mouth daily as needed for dizziness (vertigo).     metoprolol tartrate  (LOPRESSOR) 25 MG tablet Take 25 mg by mouth 2 (two) times daily.     Multiple Vitamins-Minerals (MEGA MULTIVITAMIN FOR WOMEN) TABS Take by mouth.     Pitavastatin Calcium 4 MG TABS Take 4 mg by mouth daily.     ranitidine (ZANTAC) 150 MG tablet Take 150 mg by mouth daily.     triamcinolone (KENALOG) 0.025 % ointment triamcinolone acetonide 0.025 % topical ointment  APPLY THIN LAYER TOPICALLY TO THE AFFECTED AREA TWICE DAILY     apixaban (ELIQUIS) 5 MG TABS tablet Take 2 tablets (10 mg total) by mouth 2 (two) times daily for 7 days, THEN 1 tablet (5 mg total) 2 (two) times daily for 23 days. (Patient taking differently: 2 (two) times daily) 74 tablet 0   No facility-administered medications prior to visit.       Objective:   Physical Exam: General appearance: 88 y.o., female, NAD, conversant  Eyes: anicteric sclerae; PERRL, tracking appropriately HENT: NCAT; MMM Neck: Trachea midline; no lymphadenopathy, no JVD Lungs: CTAB, no crackles, no wheeze, with normal respiratory effort CV: RRR, no murmur  Abdomen: Soft, non-tender; non-distended, BS present  Extremities: No peripheral edema, warm, +clubbing, ?synovitis PIPs Skin: Normal turgor and texture; no rash Psych: Appropriate affect Neuro: Alert and oriented to person and place, no focal deficit     Vitals:   03/12/21 1423  BP: (!) 130/56  Pulse: 79  Temp: 99 F (37.2 C)  TempSrc: Oral  SpO2: 97%  Weight: 185 lb (83.9 kg)  Height: 5\' 2"  (1.575 m)    97% on RA BMI Readings from Last 3 Encounters:  03/12/21 33.84 kg/m  12/12/20 29.04 kg/m  09/06/20 29.88 kg/m   Wt Readings from Last 3 Encounters:  03/12/21 185 lb (83.9 kg)  12/12/20 185 lb 6.4 oz (84.1 kg)  09/06/20 190 lb 12.8 oz (86.5 kg)     CBC    Component Value Date/Time   WBC 5.9 09/06/2020 1600   RBC 4.09 09/06/2020 1600   HGB 11.6 (L) 09/06/2020 1600   HGB 11.6 01/09/2020 1119   HCT 36.0 09/06/2020 1600   HCT 35.8 01/09/2020 1119   PLT 210.0  09/06/2020 1600   PLT 225 01/09/2020 1119   MCV 88.0 09/06/2020 1600   MCV 88 01/09/2020 1119   MCH 28.4 01/09/2020 1119   MCH 28.3 02/22/2018 0531   MCHC 32.3 09/06/2020 1600   RDW 14.7 09/06/2020 1600   RDW 13.3 01/09/2020 1119   LYMPHSABS 1.1 09/06/2020 1600   MONOABS 0.4  09/06/2020 1600   EOSABS 0.2 09/06/2020 1600   BASOSABS 0.0 09/06/2020 1600    Eos 200, Hb stable, TSH WNL   Chest Imaging: CXR 02/19/18 reviewed by me and remarkable for enlarged cardiac silhouette  V/Q Scan 02/19/18: There are apparent segmental perfusion defects that associated ventilation lesions. This study is felt to constitute a high probability of pulmonary embolus based on PIOPED II criteria.  Pulmonary Functions Testing Results: PFT Results Latest Ref Rng & Units 03/12/2021  FVC-Pre L 1.61  FVC-Predicted Pre % 130  FVC-Post L 1.70  FVC-Predicted Post % 138  Pre FEV1/FVC % % 74  Post FEV1/FCV % % 78  FEV1-Pre L 1.20  FEV1-Predicted Pre % 123  FEV1-Post L 1.32  DLCO uncorrected ml/min/mmHg 13.18  DLCO corrected ml/min/mmHg 13.18   Spirometry reviewed by me unremarkable, DLCO seems to be within normal range for patient of her age and height  Split night sleep study 07/20/18:  IMPRESSIONS - No significant obstructive sleep apnea occurred during this study (AHI = 4.6/h). - No significant central sleep apnea occurred during this study (CAI = 0.0/h). - The patient had minimal or no oxygen desaturation during the study (Min O2 = 93.0%) - The patient snored with soft snoring volume. - No cardiac abnormalities were noted during this study. - Clinically significant periodic limb movements did not occur during sleep. No significant associated arousals. - Fragmented sleep with frequent nonspecific arousals and awakenings. Total sleep time 145 minutes.    Echocardiogram:  TTE 02/03/20: 1. Left ventricular ejection fraction, by estimation, is 60 to 65%. The  left ventricle has normal function. The  left ventricle has no regional  wall motion abnormalities. Left ventricular diastolic parameters are  indeterminate. Elevated left ventricular  end-diastolic pressure.   2. Right ventricular systolic function is normal. The right ventricular  size is normal. There is normal pulmonary artery systolic pressure.   3. Right atrial size was mildly dilated.   4. The mitral valve is normal in structure. Trivial mitral valve  regurgitation. No evidence of mitral stenosis.   5. The aortic valve is tricuspid. There is mild calcification of the  aortic valve. There is mild thickening of the aortic valve. Aortic valve  regurgitation is not visualized. No aortic stenosis is present.   6. The inferior vena cava is normal in size with greater than 50%  respiratory variability, suggesting right atrial pressure of 3 mmHg.   TTE 02/19/18: - Normal LV systolic function; D shaped septum consistent with    pulmonary hypertension; mild RAE and RVE; severe RV dysfunction.   Heart Catheterization: None    Assessment & Plan:   # Dyspnea on exertion: Unclear etiology. ?Deconditioning, diastolic dysfunction. Seems to be improved relative to last visit and not especially bothersome to the patient. Her pre/post BD spirometry are normal and her diffusing capacity also appears to be normal.    Plan: - return to clinic as needed for worsening unexplained shortness of breath     Maryjane Hurter, MD [facility] 03/12/2021 7:17 PM

## 2021-03-12 ENCOUNTER — Ambulatory Visit (INDEPENDENT_AMBULATORY_CARE_PROVIDER_SITE_OTHER): Payer: Medicare Other | Admitting: Pulmonary Disease

## 2021-03-12 ENCOUNTER — Ambulatory Visit (INDEPENDENT_AMBULATORY_CARE_PROVIDER_SITE_OTHER): Payer: Medicare Other | Admitting: Student

## 2021-03-12 ENCOUNTER — Encounter: Payer: Self-pay | Admitting: Student

## 2021-03-12 ENCOUNTER — Other Ambulatory Visit: Payer: Self-pay

## 2021-03-12 VITALS — BP 130/56 | HR 79 | Temp 99.0°F | Ht 62.0 in | Wt 185.0 lb

## 2021-03-12 DIAGNOSIS — R0609 Other forms of dyspnea: Secondary | ICD-10-CM

## 2021-03-12 LAB — PULMONARY FUNCTION TEST
DL/VA: 3.33 ml/min/mmHg/L
DLCO cor: 13.18 ml/min/mmHg
DLCO unc: 13.18 ml/min/mmHg
FEF2575-%Change-Post: 48 %

## 2021-03-12 NOTE — Patient Instructions (Addendum)
-   Lung function looks good today. Come back and see [place] if you have worsening trouble breathing limiting your activities at home.

## 2021-03-12 NOTE — Progress Notes (Signed)
Spirometry pre and post done today. 

## 2021-03-25 LAB — PULMONARY FUNCTION TEST
FEF 25-75 Post: 1.23 L/sec
FEF 25-75 Pre: 0.83 L/sec
FEF2575-%Change-Post: 48 %
FEF2575-%Pred-Post: 191 %
FEF2575-%Pred-Pre: 129 %
FEV1-%Change-Post: 10 %
FEV1-%Pred-Post: 136 %
FEV1-%Pred-Pre: 123 %
FEV1-Post: 1.32 L
FEV1-Pre: 1.2 L
FEV1FVC-%Change-Post: 4 %
FEV1FVC-%Pred-Pre: 102 %
FEV6-%Change-Post: 5 %
FEV6-%Pred-Post: 145 %
FEV6-%Pred-Pre: 137 %
FEV6-Post: 1.69 L
FEV6-Pre: 1.6 L
FEV6FVC-%Change-Post: 0 %
FEV6FVC-%Pred-Post: 105 %
FEV6FVC-%Pred-Pre: 105 %
FVC-%Change-Post: 5 %
FVC-%Pred-Post: 138 %
FVC-%Pred-Pre: 130 %
FVC-Post: 1.7 L
FVC-Pre: 1.61 L
Post FEV1/FVC ratio: 78 %
Post FEV6/FVC ratio: 99 %
Pre FEV1/FVC ratio: 74 %
Pre FEV6/FVC Ratio: 100 %

## 2021-05-27 ENCOUNTER — Telehealth: Payer: Self-pay | Admitting: Interventional Cardiology

## 2021-05-27 NOTE — Telephone Encounter (Signed)
Pt c/o medication issue: ? ?1. Name of Medication: Tromethazine DM syrup  ? ?2. How are you currently taking this medication (dosage and times per day)? 5 ML every 4 hrs as needed  ? ?3. Are you having a reaction (difficulty breathing--STAT)?  ? ?4. What is your medication issue? They are wanting to know if it is ok for her to take the med?   ? ? ?Best number [phone] or [phone] ?

## 2021-05-28 NOTE — Telephone Encounter (Signed)
There are no notable drug interactions with promethazine DM. However promethazine can cause significant anticholinergic effects (constipation, xerostomia, blurred vision, urinary retention) and can also cause decrease alertness (ie drowsiness). This is a concern in the elderly with risk of fall. Im not sure who prescribed but she might be better off with just Delsym over the counter. ?

## 2021-05-28 NOTE — Telephone Encounter (Signed)
Spoke with patient's son-in-law Joneen Caraway (OK per [facility]). ? ?Per Lenna Sciara PharmD: ?There are no notable drug interactions with promethazine DM. However promethazine can cause significant anticholinergic effects (constipation, xerostomia, blurred vision, urinary retention) and can also cause decrease alertness (ie drowsiness). This is a concern in the elderly with risk of fall. Im not sure who prescribed but she might be better off with just Delsym over the counter. ? ?Joneen Caraway verbalized understanding and expressed appreciation for call. ?

## 2021-06-06 NOTE — Progress Notes (Signed)
?Cardiology Office Note:   ? ?Date:  06/07/2021  ? ?ID:  Morgan Harrell, DOB 08-25-1924, MRN [id number] ? ?PCP:  Clovia Cuff, MD  ?Cardiologist:  [name], MD  ? ?Referring MD: Clovia Cuff, MD  ? ?Chief Complaint  ?Patient presents with  ? Shortness of Breath  ? Congestive Heart Failure  ? ? ?History of Present Illness:   ? ?Morgan Harrell is a 88 y.o. female with a hx of recurrent pulmonary embolism (most recent January 2020), chronic apixaban therapy, CKD III, primary hypertension, hyperlipidemia, colon cancer 1996 (status post surgery), bilateral severe knee ostial arthritis, gastroesophageal reflux disease, and hypothyroidism. ?  ?Swelling in her lower extremity also associated with pain. ? ?Starting 10 to 14 days ago she had pain in her left great toe.  Following this the entire leg swell up including the knee.  There was some pain in the knee.  Subsequently, the swelling has gradually improved.  The pain in the toe and knee have resolved.  She saw her primary physician, Dr. Cleda Clarks.  He ordered a chest x-ray and told them that she had fluid around the left lung.  They cannot remember what his recommendation was. ? ?[name] states that [name] has shortness of breath and wheezing when she moves around.  Mrs. Maselli does not suffer from orthopnea.  She has had intermittent left and right lower extremity swelling in the past.  She is compliant with the current medication regimen although has a little bit of struggle remembering exactly what the doses of therapy are. ? ?Past Medical History:  ?Diagnosis Date  ? GERD (gastroesophageal reflux disease)   ? HLD (hyperlipidemia)   ? HTN (hypertension)   ? Hypothyroidism   ? ? ?Past Surgical History:  ?Procedure Laterality Date  ? ABDOMINAL HYSTERECTOMY    ? Colon cancer    ? 1996  ? ? ?Current Medications: ?Current Meds  ?Medication Sig  ? albuterol (PROVENTIL HFA;VENTOLIN HFA) 108 (90 Base) MCG/ACT inhaler Inhale 2 puffs into the lungs every 4  (four) hours as needed for wheezing or shortness of breath.  ? apixaban (ELIQUIS) 5 MG TABS tablet Take 5 mg by mouth 2 (two) times daily.  ? benzonatate (TESSALON) 100 MG capsule Take 100 mg by mouth 3 (three) times daily.  ? Cyanocobalamin (VITAMIN B-12 PO) Take by mouth.  ? dapagliflozin propanediol (FARXIGA) 5 MG TABS tablet Take 1 tablet (5 mg total) by mouth daily before breakfast.  ? diclofenac Sodium (VOLTAREN) 1 % GEL diclofenac 1 % topical gel  ? fluticasone (FLONASE) 50 MCG/ACT nasal spray Place 2 sprays into both nostrils daily.  ? furosemide (LASIX) 40 MG tablet Take 1 tablet (40 mg total) by mouth daily.  ? gabapentin (NEURONTIN) 100 MG capsule Take 100 mg by mouth 3 (three) times daily.  ? ketorolac (ACULAR) 0.5 % ophthalmic solution as needed.  ? levothyroxine (SYNTHROID, LEVOTHROID) 50 MCG tablet Take 50 mcg by mouth daily before breakfast.  ? loratadine (CLARITIN) 10 MG tablet Take 10 mg by mouth daily.  ? losartan (COZAAR) 50 MG tablet Take 1 tablet (50 mg total) by mouth daily.  ? meclizine (ANTIVERT) 25 MG tablet Take 25 mg by mouth daily as needed for dizziness (vertigo).  ? metoprolol tartrate (LOPRESSOR) 25 MG tablet Take 25 mg by mouth 2 (two) times daily.  ? Multiple Vitamins-Minerals (MEGA MULTIVITAMIN FOR WOMEN) TABS Take by mouth.  ? Pitavastatin Calcium 4 MG TABS Take 4 mg by mouth daily.  ?  ranitidine (ZANTAC) 150 MG tablet Take 150 mg by mouth daily.  ? triamcinolone (KENALOG) 0.025 % ointment triamcinolone acetonide 0.025 % topical ointment ? APPLY THIN LAYER TOPICALLY TO THE AFFECTED AREA TWICE DAILY  ? [DISCONTINUED] furosemide (LASIX) 20 MG tablet Take 2 tablets by mouth once daily every other day, alternating with 20mg  once daily on the other days.  ?  ? ?Allergies:   Codeine  ? ?Social History  ? ?Socioeconomic History  ? Marital status: Widowed  ?  Spouse name: Not on file  ? Number of children: Not on file  ? Years of education: Not on file  ? Highest education level: Not on  file  ?Occupational History  ? Not on file  ?Tobacco Use  ? Smoking status: Never  ? Smokeless tobacco: Never  ?Substance and Sexual Activity  ? Alcohol use: Not Currently  ? Drug use: Never  ? Sexual activity: Not on file  ?Other Topics Concern  ? Not on file  ?Social History Narrative  ? Not on file  ? ?Social Determinants of Health  ? ?Financial Resource Strain: Not on file  ?Food Insecurity: Not on file  ?Transportation Needs: Not on file  ?Physical Activity: Not on file  ?Stress: Not on file  ?Social Connections: Not on file  ?  ? ?Family History: ?The patient's family history includes Alzheimer's disease in her mother; CAD in her sister; Heart disease in her brother; Hypertension in her brother, father, mother, and sister; Stroke in her mother. ? ?ROS:   ?Please see the history of present illness.    ?No chest pain, fever, chills.  All other systems reviewed and are negative. ? ?EKGs/Labs/Other Studies Reviewed:   ? ?The following studies were reviewed today: ?Laboratory data: ?BUN 33 creatinine 1.49 January 07, 2021. ?Hemoglobin 11.31 August 2020 ? ?2D Doppler echocardiogram 02/03/2020: ?IMPRESSIONS  ? ? ? 1. Left ventricular ejection fraction, by estimation, is 60 to 65%. The  ?left ventricle has normal function. The left ventricle has no regional  ?wall motion abnormalities. Left ventricular diastolic parameters are  ?indeterminate. Elevated left ventricular  ?end-diastolic pressure.  ? 2. Right ventricular systolic function is normal. The right ventricular  ?size is normal. There is normal pulmonary artery systolic pressure.  ? 3. Right atrial size was mildly dilated.  ? 4. The mitral valve is normal in structure. Trivial mitral valve  ?regurgitation. No evidence of mitral stenosis.  ? 5. The aortic valve is tricuspid. There is mild calcification of the  ?aortic valve. There is mild thickening of the aortic valve. Aortic valve  ?regurgitation is not visualized. No aortic stenosis is present.  ? 6. The  inferior vena cava is normal in size with greater than 50%  ?respiratory variability, suggesting right atrial pressure of 3 mmHg.  ? ? ?EKG:  EKG not performed.  On auscultation, the rhythm is regular. ? ?Recent Labs: ?09/06/2020: Hemoglobin 11.6; Platelets 210.0; TSH 2.28 ?12/12/2020: NT-Pro BNP 854 ?01/07/2021: BUN 33; Creatinine, Ser 1.49; Potassium 4.7; Sodium 144  ?Recent Lipid Panel ?   ?Component Value Date/Time  ? CHOL 119 02/19/2018 0320  ? TRIG 113 02/19/2018 0320  ? HDL 37 (L) 02/19/2018 0320  ? CHOLHDL 3.2 02/19/2018 0320  ? VLDL 23 02/19/2018 0320  ? [place] 59 02/19/2018 0320  ? ? ?Physical Exam:   ? ?VS:  BP (!) 142/82   Pulse (!) 52   Ht 5\' 2"  (1.575 m)   Wt 187 lb 6.4 oz (85 kg)   SpO2  98%   BMI 34.28 kg/m?    ? ?Wt Readings from Last 3 Encounters:  ?06/07/21 187 lb 6.4 oz (85 kg)  ?03/12/21 185 lb (83.9 kg)  ?12/12/20 185 lb 6.4 oz (84.1 kg)  ?  ? ?GEN: Wearing a mask. No acute distress ?HEENT: Normal ?NECK: Sitting at 90 degrees in a wheelchair, JVD is noted one half the way up the neck bilaterally. ?LYMPHATICS: No lymphadenopathy ?CARDIAC: No murmur. RRR S4 but no S3 gallop, with trace left ankle edema and no edema in the right lower extremity. ?VASCULAR:  Normal Pulses. No bruits. ?RESPIRATORY:  Clear to auscultation without rales, wheezing or rhonchi  ?ABDOMEN: Soft, non-tender, non-distended, No pulsatile mass, ?MUSCULOSKELETAL: No deformity  ?SKIN: Warm and dry ?NEUROLOGIC:  Alert and oriented x 3 ?PSYCHIATRIC:  Normal affect  ? ?ASSESSMENT:   ? ?1. DOE (dyspnea on exertion)   ?2. Chronic diastolic heart failure ([place])   ?3. Right leg swelling   ?4. Primary hypertension   ?5. Mixed hyperlipidemia   ?6. Stage 3a chronic kidney disease ([place])   ? ?PLAN:   ? ?In order of problems listed above: ? ?Dyspnea on exertion suspected to be related to diastolic heart failure.  Increase furosemide to 40 mg every day instead of 20 and 40 mg on alternate days.  A brain natruretic peptide and basic  metabolic panel will be obtained today. ?Depending upon the results of today's laboratory data, I will likely also recommend that Farxiga be increased to 10 mg/day. ?Left lower extremity swelling is significantly improved

## 2021-06-07 ENCOUNTER — Ambulatory Visit (INDEPENDENT_AMBULATORY_CARE_PROVIDER_SITE_OTHER): Payer: Medicare Other | Admitting: Interventional Cardiology

## 2021-06-07 ENCOUNTER — Encounter: Payer: Self-pay | Admitting: Interventional Cardiology

## 2021-06-07 VITALS — BP 142/82 | HR 52 | Ht 62.0 in | Wt 187.4 lb

## 2021-06-07 DIAGNOSIS — I10 Essential (primary) hypertension: Secondary | ICD-10-CM | POA: Diagnosis not present

## 2021-06-07 DIAGNOSIS — I5032 Chronic diastolic (congestive) heart failure: Secondary | ICD-10-CM

## 2021-06-07 DIAGNOSIS — M7989 Other specified soft tissue disorders: Secondary | ICD-10-CM

## 2021-06-07 DIAGNOSIS — E782 Mixed hyperlipidemia: Secondary | ICD-10-CM

## 2021-06-07 DIAGNOSIS — R0609 Other forms of dyspnea: Secondary | ICD-10-CM

## 2021-06-07 DIAGNOSIS — N1831 Chronic kidney disease, stage 3a: Secondary | ICD-10-CM

## 2021-06-07 MED ORDER — FUROSEMIDE 40 MG PO TABS: 40.0000 mg | ORAL_TABLET | Freq: Every day | ORAL | 3 refills | Status: DC

## 2021-06-07 NOTE — Patient Instructions (Signed)
Medication Instructions:  ?Your physician has recommended you make the following change in your medication:  ? ?1) CHANGE Furosemide (Lasix) to 40mg  once daily ? ?*If you need a refill on your cardiac medications before your next appointment, please call your pharmacy* ? ?Lab Work: ?TODAY: BMET, BNP ?If you have labs (blood work) drawn today and your tests are completely normal, you will receive your results only by: ?MyChart Message (if you have MyChart) OR ?A paper copy in the mail ?If you have any lab test that is abnormal or we need to change your treatment, we will call you to review the results. ? ?Testing/Procedures: ?NONE ? ?Follow-Up: ?At [facility], you and your health needs are our priority.  As part of our continuing mission to provide you with exceptional heart care, we have created designated Provider Care Teams.  These Care Teams include your primary Cardiologist (physician) and Advanced Practice Providers (APPs -  Physician Assistants and Nurse Practitioners) who all work together to provide you with the care you need, when you need it. ? ?Your next appointment:   ?1 month(s) ? ?The format for your next appointment:   ?In Person ? ?Provider:   ?[name], MD { ? ? ?Important Information About Sugar ? ? ? ? ?  ?

## 2021-06-08 LAB — PRO B NATRIURETIC PEPTIDE: NT-Pro BNP: 946 pg/mL — ABNORMAL HIGH (ref 0–738)

## 2021-06-08 LAB — BASIC METABOLIC PANEL
BUN/Creatinine Ratio: 40 — ABNORMAL HIGH (ref 12–28)
BUN: 61 mg/dL — ABNORMAL HIGH (ref 10–36)
CO2: 19 mmol/L — ABNORMAL LOW (ref 20–29)
Calcium: 9.4 mg/dL (ref 8.7–10.3)
Chloride: 108 mmol/L — ABNORMAL HIGH (ref 96–106)
Creatinine, Ser: 1.53 mg/dL — ABNORMAL HIGH (ref 0.57–1.00)
Glucose: 99 mg/dL (ref 70–99)
Potassium: 5.3 mmol/L — ABNORMAL HIGH (ref 3.5–5.2)
Sodium: 142 mmol/L (ref 134–144)
eGFR: 31 mL/min/{1.73_m2} — ABNORMAL LOW (ref >59)

## 2021-07-07 NOTE — Progress Notes (Unsigned)
Cardiology Office Note:    Date:  07/08/2021   ID:  Morgan Harrell, DOB 01/20/1925, MRN [id number]  PCP:  Clovia Cuff, MD  Cardiologist:  [name], MD   Referring MD: Clovia Cuff, MD   Chief Complaint  Patient presents with   Congestive Heart Failure   Hypertension    History of Present Illness:    Morgan Harrell is a 88 y.o. female with a hx of  recurrent pulmonary embolism (most recent January 2020), chronic apixaban therapy, CKD III, primary hypertension, hyperlipidemia, colon cancer 1996 (status post surgery), bilateral severe knee ostial arthritis, gastroesophageal reflux disease, and hypothyroidism.  On previous visit, her daughter states that she has intermittent wheezing with physical activity and the patient complains of intermittent lower extremity swelling.  She has never had orthopnea.  She is doing better.  Lower extremity swelling has improved.  Still denies orthopnea, PND, but daughter states that she still hears some heavy breathing with physical activity.  Past Medical History:  Diagnosis Date   GERD (gastroesophageal reflux disease)    HLD (hyperlipidemia)    HTN (hypertension)    Hypothyroidism     Past Surgical History:  Procedure Laterality Date   ABDOMINAL HYSTERECTOMY     Colon cancer     1996    Current Medications: Current Meds  Medication Sig   albuterol (PROVENTIL HFA;VENTOLIN HFA) 108 (90 Base) MCG/ACT inhaler Inhale 2 puffs into the lungs every 4 (four) hours as needed for wheezing or shortness of breath.   apixaban (ELIQUIS) 5 MG TABS tablet Take 5 mg by mouth 2 (two) times daily.   Cyanocobalamin (VITAMIN B-12 PO) Take by mouth.   dapagliflozin propanediol (FARXIGA) 5 MG TABS tablet Take 1 tablet (5 mg total) by mouth daily before breakfast.   diclofenac Sodium (VOLTAREN) 1 % GEL diclofenac 1 % topical gel   fluticasone (FLONASE) 50 MCG/ACT nasal spray Place 2 sprays into both nostrils as needed.   furosemide (LASIX) 40 MG  tablet Take 1 tablet (40 mg total) by mouth daily.   gabapentin (NEURONTIN) 100 MG capsule Take 100 mg by mouth 3 (three) times daily.   ketorolac (ACULAR) 0.5 % ophthalmic solution as needed.   levothyroxine (SYNTHROID, LEVOTHROID) 50 MCG tablet Take 50 mcg by mouth daily before breakfast.   loratadine (CLARITIN) 10 MG tablet Take 10 mg by mouth daily.   losartan (COZAAR) 50 MG tablet Take 1 tablet (50 mg total) by mouth daily.   meclizine (ANTIVERT) 25 MG tablet Take 25 mg by mouth daily as needed for dizziness (vertigo).   metoprolol tartrate (LOPRESSOR) 25 MG tablet Take 25 mg by mouth 2 (two) times daily.   Multiple Vitamins-Minerals (MEGA MULTIVITAMIN FOR WOMEN) TABS Take by mouth.   Pitavastatin Calcium 4 MG TABS Take 4 mg by mouth daily.   triamcinolone (KENALOG) 0.025 % ointment triamcinolone acetonide 0.025 % topical ointment  APPLY THIN LAYER TOPICALLY TO THE AFFECTED AREA TWICE DAILY     Allergies:   Codeine   Social History   Socioeconomic History   Marital status: Widowed    Spouse name: Not on file   Number of children: Not on file   Years of education: Not on file   Highest education level: Not on file  Occupational History   Not on file  Tobacco Use   Smoking status: Never   Smokeless tobacco: Never  Substance and Sexual Activity   Alcohol use: Not Currently   Drug use: Never   Sexual  activity: Not on file  Other Topics Concern   Not on file  Social History Narrative   Not on file   Social Determinants of Health   Financial Resource Strain: Not on file  Food Insecurity: Not on file  Transportation Needs: Not on file  Physical Activity: Not on file  Stress: Not on file  Social Connections: Not on file     Family History: The patient's family history includes Alzheimer's disease in her mother; CAD in her sister; Heart disease in her brother; Hypertension in her brother, father, mother, and sister; Stroke in her mother.  ROS:   Please see the history  of present illness.    She is concerned about the cost of atorvastatin.  She has not picked it up.  Primary care doctor has not refilled it.  All other systems reviewed and are negative.  EKGs/Labs/Other Studies Reviewed:    The following studies were reviewed today:  2 D Doppler ECHOCARDIOGRAM 2022 IMPRESSIONS     1. Left ventricular ejection fraction, by estimation, is 60 to 65%. The  left ventricle has normal function. The left ventricle has no regional  wall motion abnormalities. Left ventricular diastolic parameters are  indeterminate. Elevated left ventricular  end-diastolic pressure.   2. Right ventricular systolic function is normal. The right ventricular  size is normal. There is normal pulmonary artery systolic pressure.   3. Right atrial size was mildly dilated.   4. The mitral valve is normal in structure. Trivial mitral valve  regurgitation. No evidence of mitral stenosis.   5. The aortic valve is tricuspid. There is mild calcification of the  aortic valve. There is mild thickening of the aortic valve. Aortic valve  regurgitation is not visualized. No aortic stenosis is present.   6. The inferior vena cava is normal in size with greater than 50%  respiratory variability, suggesting right atrial pressure of 3 mmHg.   EKG:  EKG not done.  Recent Labs: 09/06/2020: Hemoglobin 11.6; Platelets 210.0; TSH 2.28 06/07/2021: BUN 61; Creatinine, Ser 1.53; NT-Pro BNP 946; Potassium 5.3; Sodium 142  Recent Lipid Panel    Component Value Date/Time   CHOL 119 02/19/2018 0320   TRIG 113 02/19/2018 0320   HDL 37 (L) 02/19/2018 0320   CHOLHDL 3.2 02/19/2018 0320   VLDL 23 02/19/2018 0320   LDLCALC 59 02/19/2018 0320    Physical Exam:    VS:  BP (!) 132/58   Pulse (!) 54   Ht 5\' 2"  (1.575 m)   Wt 184 lb 6.4 oz (83.6 kg)   SpO2 96%   BMI 33.73 kg/m     Wt Readings from Last 3 Encounters:  07/08/21 184 lb 6.4 oz (83.6 kg)  06/07/21 187 lb 6.4 oz (85 kg)  03/12/21 185 lb  (83.9 kg)     GEN: Younger in the stated age in appearance. No acute distress HEENT: Normal NECK: No JVD. LYMPHATICS: No lymphadenopathy CARDIAC: No murmur. RRR no gallop, or edema. VASCULAR:  Normal Pulses. No bruits. RESPIRATORY:  Clear to auscultation without rales, wheezing or rhonchi  ABDOMEN: Soft, non-tender, non-distended, No pulsatile mass, MUSCULOSKELETAL: No deformity  SKIN: Warm and dry NEUROLOGIC:  Alert and oriented x 3 PSYCHIATRIC:  Normal affect   ASSESSMENT:    1. Chronic diastolic heart failure ([place])   2. DOE (dyspnea on exertion)   3. Primary hypertension   4. Right leg swelling   5. Mixed hyperlipidemia   6. Stage 3a chronic kidney disease ([place])  PLAN:    In order of problems listed above:  Peripheral edema has improved.  She does not complain of dyspnea but daughter states that she still breathes heavily with physical activity.  She is predominantly sedentary but does get up and ambulate to the kitchen to eat with the family. Improved according to the patient Improved based on today's blood pressure recordings Completely resolved We will not resume atorvastatin Today's creatinine is pending.  34-month follow-up Bmet and CBC at that time.   Medication Adjustments/Labs and Tests Ordered: Current medicines are reviewed at length with the patient today.  Concerns regarding medicines are outlined above.  No orders of the defined types were placed in this encounter.  No orders of the defined types were placed in this encounter.   There are no Patient Instructions on file for this visit.   Signed, [name], MD  07/08/2021 2:13 PM    [facility]

## 2021-07-08 ENCOUNTER — Ambulatory Visit (INDEPENDENT_AMBULATORY_CARE_PROVIDER_SITE_OTHER): Payer: Medicare Other | Admitting: Interventional Cardiology

## 2021-07-08 ENCOUNTER — Encounter: Payer: Self-pay | Admitting: Interventional Cardiology

## 2021-07-08 VITALS — BP 132/58 | HR 54 | Ht 62.0 in | Wt 184.4 lb

## 2021-07-08 DIAGNOSIS — I10 Essential (primary) hypertension: Secondary | ICD-10-CM | POA: Diagnosis not present

## 2021-07-08 DIAGNOSIS — N1831 Chronic kidney disease, stage 3a: Secondary | ICD-10-CM

## 2021-07-08 DIAGNOSIS — E782 Mixed hyperlipidemia: Secondary | ICD-10-CM

## 2021-07-08 DIAGNOSIS — R0609 Other forms of dyspnea: Secondary | ICD-10-CM

## 2021-07-08 DIAGNOSIS — M7989 Other specified soft tissue disorders: Secondary | ICD-10-CM | POA: Diagnosis not present

## 2021-07-08 DIAGNOSIS — I5032 Chronic diastolic (congestive) heart failure: Secondary | ICD-10-CM | POA: Diagnosis not present

## 2021-07-08 NOTE — Patient Instructions (Signed)
Medication Instructions:  Your physician has recommended you make the following change in your medication:   1) STOP Pitavastatin  *If you need a refill on your cardiac medications before your next appointment, please call your pharmacy*  Lab Work: In 6 months: CBC, BMET If you have labs (blood work) drawn today and your tests are completely normal, you will receive your results only by: Morgan Harrell (if you have MyChart) OR A paper copy in the mail If you have any lab test that is abnormal or we need to change your treatment, we will call you to review the results.  Testing/Procedures: NONE  Follow-Up: At [facility], you and your health needs are our priority.  As part of our continuing mission to provide you with exceptional heart care, we have created designated Provider Care Teams.  These Care Teams include your primary Cardiologist (physician) and Advanced Practice Providers (APPs -  Physician Assistants and Nurse Practitioners) who all work together to provide you with the care you need, when you need it.  Your next appointment:   6 month(s)  The format for your next appointment:   In Person  Provider:   [name], MD {   Important Information About Sugar

## 2021-07-10 ENCOUNTER — Ambulatory Visit: Payer: Medicare Other | Admitting: Interventional Cardiology

## 2021-10-09 ENCOUNTER — Ambulatory Visit: Payer: Medicare Other | Admitting: Podiatry

## 2021-10-15 ENCOUNTER — Ambulatory Visit (INDEPENDENT_AMBULATORY_CARE_PROVIDER_SITE_OTHER): Payer: Medicare Other | Admitting: Podiatry

## 2021-10-15 ENCOUNTER — Encounter: Payer: Self-pay | Admitting: Podiatry

## 2021-10-15 DIAGNOSIS — L84 Corns and callosities: Secondary | ICD-10-CM | POA: Diagnosis not present

## 2021-10-15 DIAGNOSIS — M2042 Other hammer toe(s) (acquired), left foot: Secondary | ICD-10-CM

## 2021-10-15 DIAGNOSIS — D689 Coagulation defect, unspecified: Secondary | ICD-10-CM | POA: Diagnosis not present

## 2021-10-15 DIAGNOSIS — B351 Tinea unguium: Secondary | ICD-10-CM

## 2021-10-15 DIAGNOSIS — M2011 Hallux valgus (acquired), right foot: Secondary | ICD-10-CM | POA: Diagnosis not present

## 2021-10-15 DIAGNOSIS — M79674 Pain in right toe(s): Secondary | ICD-10-CM

## 2021-10-15 DIAGNOSIS — M2041 Other hammer toe(s) (acquired), right foot: Secondary | ICD-10-CM | POA: Diagnosis not present

## 2021-10-15 DIAGNOSIS — M2012 Hallux valgus (acquired), left foot: Secondary | ICD-10-CM

## 2021-10-15 DIAGNOSIS — M79675 Pain in left toe(s): Secondary | ICD-10-CM

## 2021-10-15 NOTE — Patient Instructions (Signed)
Oofos Oomg Eezee Low Shoes with stretchable uppers and memory foam insoles can be purchased online at: www.oofos.com.  Also available in clogs, houseslippers, slide styles.  Tube foam may be purchased on [employer].  Onychomycosis/Fungal Toenails  WHAT IS IT? An infection that lies within the keratin of your nail plate that is caused by a fungus.  WHY ME? Fungal infections affect all ages, sexes, races, and creeds.  There may be many factors that predispose you to a fungal infection such as age, coexisting medical conditions such as diabetes, or an autoimmune disease; stress, medications, fatigue, genetics, etc.  Bottom line: fungus thrives in a warm, moist environment and your shoes offer such a location.  IS IT CONTAGIOUS? Theoretically, yes.  You do not want to share shoes, nail clippers or files with someone who has fungal toenails.  Walking around barefoot in the same room or sleeping in the same bed is unlikely to transfer the organism.  It is important to realize, however, that fungus can spread easily from one nail to the next on the same foot.  HOW DO WE TREAT THIS?  There are several ways to treat this condition.  Treatment may depend on many factors such as age, medications, pregnancy, liver and kidney conditions, etc.  It is best to ask your doctor which options are available to you.  No treatment.   Unlike many other medical concerns, you can live with this condition.  However for many people this can be a painful condition and may lead to ingrown toenails or a bacterial infection.  It is recommended that you keep the nails cut short to help reduce the amount of fungal nail. Topical treatment.  These range from herbal remedies to prescription strength nail lacquers.  About 40-50% effective, topicals require twice daily application for approximately 9 to 12 months or until an entirely new nail has grown out.  The most effective topicals are medical grade medications available through  physicians offices. Oral antifungal medications.  With an 80-90% cure rate, the most common oral medication requires 3 to 4 months of therapy and stays in your system for a year as the new nail grows out.  Oral antifungal medications do require blood work to make sure it is a safe drug for you.  A liver function panel will be performed prior to starting the medication and after the first month of treatment.  It is important to have the blood work performed to avoid any harmful side effects.  In general, this medication safe but blood work is required. Laser Therapy.  This treatment is performed by applying a specialized laser to the affected nail plate.  This therapy is noninvasive, fast, and non-painful.  It is not covered by insurance and is therefore, out of pocket.  The results have been very good with a 80-95% cure rate.  The [facility] is the only practice in the area to offer this therapy. Permanent Nail Avulsion.  Removing the entire nail so that a new nail will not grow back.  Corns and Calluses Corns are small areas of thickened skin that form on the top, sides, or tip of a toe. Corns have a cone-shaped core with a point that can press on a nerve below. This causes pain. Calluses are areas of thickened skin that can form anywhere on the body, including the hands, fingers, palms, soles of the feet, and heels. Calluses are usually larger than corns. What are the causes? Corns and calluses are caused by  rubbing (friction) or pressure, such as from shoes that are too tight or do not fit properly. What increases the risk? Corns are more likely to develop in people who have misshapen toes (toe deformities), such as hammer toes. Calluses can form with friction to any area of the skin. They are more likely to develop in people who: Work with their hands. Wear shoes that fit poorly, are too tight, or are high-heeled. Have toe deformities. What are the signs or symptoms? Symptoms of a corn or  callus include: A hard growth on the skin. Pain or tenderness under the skin. Redness and swelling. Increased discomfort while wearing tight-fitting shoes, if your feet are affected. If a corn or callus becomes infected, symptoms may include: Redness and swelling that gets worse. Pain. Fluid, blood, or pus draining from the corn or callus. How is this diagnosed? Corns and calluses may be diagnosed based on your symptoms, your medical history, and a physical exam. How is this treated? Treatment for corns and calluses may include: Removing the cause of the friction or pressure. This may involve: Changing your shoes. Wearing shoe inserts (orthotics) or other protective layers in your shoes, such as a corn pad. Wearing gloves. Applying medicine to the skin (topical medicine) to help soften skin in the hardened, thickened areas. Removing layers of dead skin with a file to reduce the size of the corn or callus. Removing the corn or callus with a scalpel or laser. Taking antibiotic medicines, if your corn or callus is infected. Having surgery, if a toe deformity is the cause. Follow these instructions at home:  Take over-the-counter and prescription medicines only as told by your health care provider. If you were prescribed an antibiotic medicine, take it as told by your health care provider. Do not stop taking it even if your condition improves. Wear shoes that fit well. Avoid wearing high-heeled shoes and shoes that are too tight or too loose. Wear any padding, protective layers, gloves, or orthotics as told by your health care provider. Soak your hands or feet. Then use a file or pumice stone to soften your corn or callus. Do this as told by your health care provider. Check your corn or callus every day for signs of infection. Contact a health care provider if: Your symptoms do not improve with treatment. You have redness or swelling that gets worse. Your corn or callus becomes  painful. You have fluid, blood, or pus coming from your corn or callus. You have new symptoms. Get help right away if: You develop severe pain with redness. Summary Corns are small areas of thickened skin that form on the top, sides, or tip of a toe. These can be painful. Calluses are areas of thickened skin that can form anywhere on the body, including the hands, fingers, palms, and soles of the feet. Calluses are usually larger than corns. Corns and calluses are caused by rubbing (friction) or pressure, such as from shoes that are too tight or do not fit properly. Treatment may include wearing padding, protective layers, gloves, or orthotics as told by your health care provider. This information is not intended to replace advice given to you by your health care provider. Make sure you discuss any questions you have with your health care provider. Document Revised: 05/12/2019 Document Reviewed: 05/12/2019 Elsevier Patient Education  [place].

## 2021-10-20 NOTE — Progress Notes (Signed)
Subjective: Francenia Harrell presents today referred by Morgan Cuff, MD for complaint of with chief concern of elongated, thickened, painful, discolored toenails for months. Patient has tried none. Patient also has painful corns and calluses which are painful when wearing enclosed shoe gear.  She has h/o DVT and pulmonary embolism. She is on Eliquis.  Past Medical History:  Diagnosis Date   GERD (gastroesophageal reflux disease)    HLD (hyperlipidemia)    HTN (hypertension)    Hypothyroidism      Patient Active Problem List   Diagnosis Date Noted   OSA (obstructive sleep apnea) 07/20/2018   Influenza 02/19/2018   Acute CHF (congestive heart failure) ([place]) 02/19/2018   CKD (chronic kidney disease) 02/19/2018   Elevated troponin 02/19/2018   Acute respiratory failure with hypoxia ([place]) 02/19/2018   Pulmonary embolism ([place]) 02/19/2018   GERD (gastroesophageal reflux disease)    HLD (hyperlipidemia)    HTN (hypertension)    Hypothyroidism      Past Surgical History:  Procedure Laterality Date   ABDOMINAL HYSTERECTOMY     Colon cancer     1996     Current Outpatient Medications on File Prior to Visit  Medication Sig Dispense Refill   albuterol (PROVENTIL HFA;VENTOLIN HFA) 108 (90 Base) MCG/ACT inhaler Inhale 2 puffs into the lungs every 4 (four) hours as needed for wheezing or shortness of breath.     apixaban (ELIQUIS) 5 MG TABS tablet Take 5 mg by mouth 2 (two) times daily.     Cyanocobalamin (VITAMIN B-12 PO) Take by mouth.     dapagliflozin propanediol (FARXIGA) 5 MG TABS tablet Take 1 tablet (5 mg total) by mouth daily before breakfast. 30 tablet 11   diclofenac Sodium (VOLTAREN) 1 % GEL diclofenac 1 % topical gel     fluticasone (FLONASE) 50 MCG/ACT nasal spray Place 2 sprays into both nostrils as needed.     fluticasone (FLONASE) 50 MCG/ACT nasal spray 1 spray in each nostril Nasally Once a day for 30 day(s)     furosemide (LASIX) 40 MG tablet Take 1 tablet (40 mg  total) by mouth daily. 90 tablet 3   gabapentin (NEURONTIN) 100 MG capsule 1 capsule Orally three a day     ketorolac (ACULAR) 0.5 % ophthalmic solution as needed.     levothyroxine (SYNTHROID) 50 MCG tablet Take 1 tablet by mouth daily.     loratadine (CLARITIN) 10 MG tablet Take 10 mg by mouth daily.     losartan (COZAAR) 100 MG tablet Take 1 tablet by mouth daily.     meclizine (ANTIVERT) 25 MG tablet Take 25 mg by mouth daily as needed for dizziness (vertigo).     metoprolol tartrate (LOPRESSOR) 25 MG tablet Take 25 mg by mouth 2 (two) times daily.     Multiple Vitamins-Minerals (MEGA MULTIVITAMIN FOR WOMEN) TABS Take by mouth.     triamcinolone (KENALOG) 0.025 % ointment triamcinolone acetonide 0.025 % topical ointment  APPLY THIN LAYER TOPICALLY TO THE AFFECTED AREA TWICE DAILY     No current facility-administered medications on file prior to visit.     Allergies  Allergen Reactions   Codeine Nausea And Vomiting     Social History   Occupational History   Not on file  Tobacco Use   Smoking status: Never   Smokeless tobacco: Never  Substance and Sexual Activity   Alcohol use: Not Currently   Drug use: Never   Sexual activity: Not on file     Family History  Problem Relation Age of Onset   Hypertension Sister    CAD Sister        ?MI in her 23's   Hypertension Brother    Heart disease Brother        possibly CHF   Hypertension Mother    Stroke Mother    Alzheimer's disease Mother    Hypertension Father      Immunization History  Administered Date(s) Administered   Influenza, High Dose Seasonal PF 10/27/2020   PFIZER(Purple Top)SARS-COV-2 Vaccination 11/08/2019     Objective: There were no vitals filed for this visit.  Morgan Harrell is a pleasant 88 y.o. female WD, WN in NAD. AAO x 3.  Vascular Examination: Vascular with faintly palpable pedal pulses. Pedal hair absent b/l. CFT <3seconds b/l. No edema. No pain with calf compression b/l. Skin temperature  gradient WNL b/l. Evidence of chronic venous insufficiency b/l LE.  Neurological Examination: Sensation grossly intact b/l with 10 gram monofilament. Vibratory sensation intact b/l.   Dermatological Examination: Pedal skin with normal turgor, texture and tone b/l. Toenails 1-5 b/l thick, discolored, elongated with subungual debris and pain on dorsal palpation. Hyperkeratotic lesion(s) dorsal PIPJ of bilateral 5th toes, submet head 1 right foot, submet head 4 right foot, and submet head 5 b/l.  No erythema, no edema, no drainage, no fluctuance.  Musculoskeletal Examination: Muscle strength 5/5 to b/l LE. HAV with bunion bilaterally and hammertoes 2-5 b/l.  Radiographs: None  Assessment: 1. Pain due to onychomycosis of toenails of both feet   2. Corns and callosities   3. Hallux valgus, acquired, bilateral   4. Acquired hammertoes of both feet   5. Coagulation defect [facility])     Plan: -Patient was evaluated and treated. All patient's and/or POA's questions/concerns answered on today's visit. -Patient's family member present. All questions/concerns addressed on today's visit. -Discussed topical, laser and oral medication for onychomycosis. Patient opted for toenail debridement only on today.  -Toenails 1-5 b/l were debrided in length and girth with sterile nail nippers and dremel without iatrogenic bleeding.  -Corn(s) bilateral 5th toes and callus(es) submet head 1 right foot, submet head 4 right foot, and submet head 5 b/l were pared utilizing sterile scalpel blade without incident. Total number debrided =6. -Recommended Oofos Oomg Eezee Low shoes with stretchable uppers and memory foam insoles. -Dispensed tube foam. Apply to bilateral 5th toes every morning. Remove every evening. -Patient/POA to call should there be question/concern in the interim.  Return in about 3 months (around 01/14/2022).  Marzetta Board, DPM

## 2022-01-07 NOTE — Progress Notes (Signed)
Cardiology Office Note:    Date:  01/08/2022   ID:  Morgan Harrell, DOB 05/26/1924, MRN [id number]  PCP:  Morgan Cuff, MD  Cardiologist:  Morgan Grooms, MD   Referring MD: Morgan Cuff, MD   Chief Complaint  Patient presents with   Congestive Heart Failure    Diastolic heart failure    History of Present Illness:    Morgan Harrell is a 88 y.o. female with a hx of recurrent pulmonary embolism (most recent January 2020), chronic apixaban therapy, CKD III, primary hypertension, hyperlipidemia, colon cancer 1996 (status post surgery), bilateral severe knee ostial arthritis, gastroesophageal reflux disease, hypothyroidism, and chronic diastolic HF with LEE R>L.   Since last being seen in June, she has done relatively well.  Ambulates independently.  Lower extremity swelling and breathing have improved.  Past Medical History:  Diagnosis Date   GERD (gastroesophageal reflux disease)    HLD (hyperlipidemia)    HTN (hypertension)    Hypothyroidism     Past Surgical History:  Procedure Laterality Date   ABDOMINAL HYSTERECTOMY     Colon cancer     1996    Current Medications: Current Meds  Medication Sig   albuterol (PROVENTIL HFA;VENTOLIN HFA) 108 (90 Base) MCG/ACT inhaler Inhale 2 puffs into the lungs every 4 (four) hours as needed for wheezing or shortness of breath.   apixaban (ELIQUIS) 5 MG TABS tablet Take 5 mg by mouth 2 (two) times daily.   Cyanocobalamin (VITAMIN B-12 PO) Take by mouth.   dapagliflozin propanediol (FARXIGA) 5 MG TABS tablet Take 1 tablet (5 mg total) by mouth daily before breakfast.   diclofenac Sodium (VOLTAREN) 1 % GEL diclofenac 1 % topical gel   fluticasone (FLONASE) 50 MCG/ACT nasal spray Place 2 sprays into both nostrils as needed.   fluticasone (FLONASE) 50 MCG/ACT nasal spray 1 spray in each nostril Nasally Once a day for 30 day(s)   furosemide (LASIX) 40 MG tablet Take 1 tablet (40 mg total) by mouth daily.   gabapentin (NEURONTIN)  100 MG capsule 1 capsule Orally three a day   ketorolac (ACULAR) 0.5 % ophthalmic solution as needed.   levothyroxine (SYNTHROID) 50 MCG tablet Take 1 tablet by mouth daily.   loratadine (CLARITIN) 10 MG tablet Take 10 mg by mouth daily.   losartan (COZAAR) 100 MG tablet Take 1 tablet by mouth daily.   meclizine (ANTIVERT) 25 MG tablet Take 25 mg by mouth daily as needed for dizziness (vertigo).   metoprolol tartrate (LOPRESSOR) 25 MG tablet Take 25 mg by mouth 2 (two) times daily.   Multiple Vitamins-Minerals (MEGA MULTIVITAMIN FOR WOMEN) TABS Take by mouth.   triamcinolone (KENALOG) 0.025 % ointment triamcinolone acetonide 0.025 % topical ointment  APPLY THIN LAYER TOPICALLY TO THE AFFECTED AREA TWICE DAILY     Allergies:   Codeine   Social History   Socioeconomic History   Marital status: Widowed    Spouse name: Not on file   Number of children: Not on file   Years of education: Not on file   Highest education level: Not on file  Occupational History   Not on file  Tobacco Use   Smoking status: Never   Smokeless tobacco: Never  Substance and Sexual Activity   Alcohol use: Not Currently   Drug use: Never   Sexual activity: Not on file  Other Topics Concern   Not on file  Social History Narrative   Not on file   Social Determinants of Health  Financial Resource Strain: Not on file  Food Insecurity: Not on file  Transportation Needs: Not on file  Physical Activity: Not on file  Stress: Not on file  Social Connections: Not on file     Family History: The patient's family history includes Alzheimer's disease in her mother; CAD in her sister; Heart disease in her brother; Hypertension in her brother, father, mother, and sister; Stroke in her mother.  ROS:   Please see the history of present illness.    Complaints.  All other systems reviewed and are negative.  EKGs/Labs/Other Studies Reviewed:    The following studies were reviewed today:  ECHOCARDIOGRAPHY  2023: IMPRESSIONS   1. Left ventricular ejection fraction, by estimation, is 60 to 65%. The  left ventricle has normal function. The left ventricle has no regional  wall motion abnormalities. Left ventricular diastolic parameters are  indeterminate. Elevated left ventricular  end-diastolic pressure.   2. Right ventricular systolic function is normal. The right ventricular  size is normal. There is normal pulmonary artery systolic pressure.   3. Right atrial size was mildly dilated.   4. The mitral valve is normal in structure. Trivial mitral valve  regurgitation. No evidence of mitral stenosis.   5. The aortic valve is tricuspid. There is mild calcification of the  aortic valve. There is mild thickening of the aortic valve. Aortic valve  regurgitation is not visualized. No aortic stenosis is present.   6. The inferior vena cava is normal in size with greater than 50%  respiratory variability, suggesting right atrial pressure of 3 mmHg.   EKG:  EKG sinus bradycardia, 56 bpm, PAC, incomplete right bundle branch block, short PR interval, leftward axis.  PACs are noted.  No change when compared to the prior tracing done in November 2022.  Recent Labs: 06/07/2021: BUN 61; Creatinine, Ser 1.53; NT-Pro BNP 946; Potassium 5.3; Sodium 142  Recent Lipid Panel    Component Value Date/Time   CHOL 119 02/19/2018 0320   TRIG 113 02/19/2018 0320   HDL 37 (L) 02/19/2018 0320   CHOLHDL 3.2 02/19/2018 0320   VLDL 23 02/19/2018 0320   LDLCALC 59 02/19/2018 0320    Physical Exam:    VS:  BP 138/72   Pulse (!) 56   Ht 5\' 2"  (1.575 m)   Wt 191 lb 9.6 oz (86.9 kg)   SpO2 97%   BMI 35.04 kg/m     Wt Readings from Last 3 Encounters:  01/08/22 191 lb 9.6 oz (86.9 kg)  07/08/21 184 lb 6.4 oz (83.6 kg)  06/07/21 187 lb 6.4 oz (85 kg)     GEN: Appears younger than stated age. No acute distress HEENT: Normal NECK: No JVD. LYMPHATICS: No lymphadenopathy CARDIAC: No murmur. RRR no gallop, or  edema. VASCULAR:  Normal Pulses. No bruits. RESPIRATORY:  Clear to auscultation without rales, wheezing or rhonchi  ABDOMEN: Soft, non-tender, non-distended, No pulsatile mass, MUSCULOSKELETAL: No deformity  SKIN: Warm and dry NEUROLOGIC:  Alert and oriented x 3 PSYCHIATRIC:  Normal affect   ASSESSMENT:    1. Chronic diastolic heart failure ([place])   2. Primary hypertension   3. Mixed hyperlipidemia   4. Stage 3a chronic kidney disease ([place])   5. DOE (dyspnea on exertion)   6. Coronary artery calcification    PLAN:    In order of problems listed above:  Continue dapagliflozin 5 mg/day.  Basic metabolic panel and BNP today. Good blood pressure control on current regimen.  Basic metabolic panel may  help [place] decide if we can decrease intensity of furosemide.   Not currently on therapy for lipids. Basic metabolic panel.  Consider decreasing the intensity of apixaban based on kidney function from today's blood work Improved since starting dapagliflozin. No active symptoms or management strategy at her current age.    Referral to Dr. Philip Aspen   Medication Adjustments/Labs and Tests Ordered: Current medicines are reviewed at length with the patient today.  Concerns regarding medicines are outlined above.  No orders of the defined types were placed in this encounter.  No orders of the defined types were placed in this encounter.   There are no Patient Instructions on file for this visit.   Signed, Morgan Grooms, MD  01/08/2022 1:55 PM    [facility]

## 2022-01-08 ENCOUNTER — Ambulatory Visit: Payer: Medicare Other

## 2022-01-08 ENCOUNTER — Other Ambulatory Visit: Payer: Self-pay

## 2022-01-08 ENCOUNTER — Ambulatory Visit: Payer: Medicare Other | Attending: Interventional Cardiology | Admitting: Interventional Cardiology

## 2022-01-08 ENCOUNTER — Encounter: Payer: Self-pay | Admitting: Interventional Cardiology

## 2022-01-08 VITALS — BP 138/72 | HR 56 | Ht 62.0 in | Wt 191.6 lb

## 2022-01-08 DIAGNOSIS — I5032 Chronic diastolic (congestive) heart failure: Secondary | ICD-10-CM

## 2022-01-08 DIAGNOSIS — R0609 Other forms of dyspnea: Secondary | ICD-10-CM | POA: Diagnosis present

## 2022-01-08 DIAGNOSIS — I2584 Coronary atherosclerosis due to calcified coronary lesion: Secondary | ICD-10-CM

## 2022-01-08 DIAGNOSIS — I10 Essential (primary) hypertension: Secondary | ICD-10-CM

## 2022-01-08 DIAGNOSIS — N1831 Chronic kidney disease, stage 3a: Secondary | ICD-10-CM

## 2022-01-08 DIAGNOSIS — E782 Mixed hyperlipidemia: Secondary | ICD-10-CM

## 2022-01-08 DIAGNOSIS — I2510 Atherosclerotic heart disease of native coronary artery without angina pectoris: Secondary | ICD-10-CM | POA: Diagnosis not present

## 2022-01-08 LAB — CBC
Hematocrit: 38.1 % (ref 34.0–46.6)
Hemoglobin: 12.5 g/dL (ref 11.1–15.9)
MCH: 29.2 pg (ref 26.6–33.0)
MCHC: 32.8 g/dL (ref 31.5–35.7)
MCV: 89 fL (ref 79–97)
Platelets: 221 10*3/uL (ref 150–450)
RBC: 4.28 x10E6/uL (ref 3.77–5.28)
RDW: 16.3 % — ABNORMAL HIGH (ref 11.7–15.4)
WBC: 6.3 10*3/uL (ref 3.4–10.8)

## 2022-01-08 MED ORDER — DAPAGLIFLOZIN PROPANEDIOL 5 MG PO TABS: 5.0000 mg | ORAL_TABLET | Freq: Every day | ORAL | 6 refills | Status: DC

## 2022-01-08 MED ORDER — FUROSEMIDE 40 MG PO TABS: 40.0000 mg | ORAL_TABLET | Freq: Every day | ORAL | 3 refills | Status: DC

## 2022-01-08 MED ORDER — DAPAGLIFLOZIN PROPANEDIOL 5 MG PO TABS: 5.0000 mg | ORAL_TABLET | Freq: Every day | ORAL | 11 refills | Status: DC

## 2022-01-08 NOTE — Patient Instructions (Addendum)
Medication Instructions:  Your physician recommends that you continue on your current medications as directed. Please refer to the Current Medication list given to you today.  *If you need a refill on your cardiac medications before your next appointment, please call your pharmacy*  Lab Work: TODAY: BMET, BNP If you have labs (blood work) drawn today and your tests are completely normal, you will receive your results only by: [place] (if you have MyChart) OR A paper copy in the mail If you have any lab test that is abnormal or we need to change your treatment, we will call you to review the results.  Follow-Up: At [facility], you and your health needs are our priority.  As part of our continuing mission to provide you with exceptional heart care, we have created designated Provider Care Teams.  These Care Teams include your primary Cardiologist (physician) and Advanced Practice Providers (APPs -  Physician Assistants and Nurse Practitioners) who all work together to provide you with the care you need, when you need it.  Your next appointment:   9-12 month(s)  The format for your next appointment:   In Person  Provider:   Skeet Latch, MD or Berniece Salines, MD or Phineas Inches, MD  Important Information About Sugar

## 2022-01-09 LAB — BASIC METABOLIC PANEL
BUN/Creatinine Ratio: 32 — ABNORMAL HIGH (ref 12–28)
BUN: 65 mg/dL — ABNORMAL HIGH (ref 10–36)
CO2: 22 mmol/L (ref 20–29)
Calcium: 9.6 mg/dL (ref 8.7–10.3)
Chloride: 105 mmol/L (ref 96–106)
Creatinine, Ser: 2.06 mg/dL — ABNORMAL HIGH (ref 0.57–1.00)
Glucose: 93 mg/dL (ref 70–99)
Potassium: 5 mmol/L (ref 3.5–5.2)
Sodium: 143 mmol/L (ref 134–144)
eGFR: 22 mL/min/{1.73_m2} — ABNORMAL LOW (ref >59)

## 2022-01-09 LAB — PRO B NATRIURETIC PEPTIDE: NT-Pro BNP: 1198 pg/mL — ABNORMAL HIGH (ref 0–738)

## 2022-01-29 ENCOUNTER — Encounter: Payer: Self-pay | Admitting: Podiatry

## 2022-01-29 ENCOUNTER — Ambulatory Visit (INDEPENDENT_AMBULATORY_CARE_PROVIDER_SITE_OTHER): Payer: Medicare Other | Admitting: Podiatry

## 2022-01-29 VITALS — BP 138/60

## 2022-01-29 DIAGNOSIS — D689 Coagulation defect, unspecified: Secondary | ICD-10-CM | POA: Diagnosis not present

## 2022-01-29 DIAGNOSIS — M79675 Pain in left toe(s): Secondary | ICD-10-CM | POA: Diagnosis not present

## 2022-01-29 DIAGNOSIS — M79674 Pain in right toe(s): Secondary | ICD-10-CM | POA: Diagnosis not present

## 2022-01-29 DIAGNOSIS — L84 Corns and callosities: Secondary | ICD-10-CM | POA: Diagnosis not present

## 2022-01-29 DIAGNOSIS — B351 Tinea unguium: Secondary | ICD-10-CM | POA: Diagnosis not present

## 2022-01-29 NOTE — Progress Notes (Signed)
  Subjective:  Patient ID: Morgan Harrell, female    DOB: Mar 05, 1924,  MRN: [id number]  Morgan Harrell presents to clinic today for at risk care with h/o coagulation defect. Patient is on long term blood thinner Eliquis. and corn(s) b/l lower extremities, callus(es) b/l lower extremities and painful mycotic nails.  Pain interferes with ambulation. Aggravating factors include wearing enclosed shoe gear. Painful toenails interfere with ambulation. Aggravating factors include wearing enclosed shoe gear. Pain is relieved with periodic professional debridement. Painful corns and calluses are aggravated when weightbearing with and without shoegear. Pain is relieved with periodic professional debridement.   She continues to c/o toe pain. Most symptomatic is her right 5th toe. She has not purchased shoes with soft, stretchable uppers as discussed on last visit. She has not had a chance to get to the store. She is accompanied by her daughter and son in law on today's visit.  Chief Complaint  Patient presents with   Nail Problem    [facility] PCP-[facility] PCP VST- Fall 2023   New problem(s): None.   PCP is Clovia Cuff, MD.  Allergies  Allergen Reactions   Codeine Nausea And Vomiting    Review of Systems: Negative except as noted in the HPI.  Objective:  Vitals:   01/29/22 1128  BP: 138/60   Morgan Harrell is a pleasant 89 y.o. female WD, WN in NAD. AAO x 3. Vascular Examination: Vascular with faintly palpable pedal pulses. Pedal hair absent b/l. CFT <3seconds b/l. No edema. No pain with calf compression b/l. Skin temperature gradient WNL b/l. Evidence of chronic venous insufficiency b/l LE.  Neurological Examination: Sensation grossly intact b/l with 10 gram monofilament. Vibratory sensation intact b/l.   Dermatological Examination: Pedal skin with normal turgor, texture and tone b/l. Toenails 1-5 b/l thick, discolored, elongated with subungual debris and pain on dorsal palpation.    Hyperkeratotic lesion(s) dorsal PIPJ of bilateral 5th toes. dorsal DIPJ right 5th toe, submet head 1 right foot, submet head 4 right foot, and submet head 5 b/l.  No erythema, no edema, no drainage, no fluctuance.  Musculoskeletal Examination: Muscle strength 5/5 to b/l LE. HAV with bunion bilaterally and hammertoes 2-5 b/l. She continues to wear Karle Starch type shoes with leather uppers which do not accommodate her hammertoe deformities.  Radiographs: None  Assessment/Plan: 1. Pain due to onychomycosis of toenails of both feet   2. Corns and callosities   3. Coagulation defect ([place])     No orders of the defined types were placed in this encounter.  -Patient's family member present. All questions/concerns addressed on today's visit. -Examined patient. -Recommended [employer] shoes. She is to choose from shoes in the [employer]. -Toenails 1-5 b/l were debrided in length and girth with sterile nail nippers and dremel without iatrogenic bleeding.  -Corn(s) dorsal DIPJ of right fifth digit and dorsal PIPJ of bilateral 5th toes pared utilizing sterile scalpel blade without complication or incident. Total number debrided=3. -Callus(es) submet head 1 right foot and submet head 5 b/l pared utilizing sterile scalpel blade without complication or incident. Total number debrided =3. -Patient/POA to call should there be question/concern in the interim.   Return in about 3 months (around 04/30/2022).  Marzetta Board, DPM

## 2022-01-29 NOTE — Patient Instructions (Signed)
[email or web address]  [employer] shoes: Choose from the [employer]

## 2022-02-13 ENCOUNTER — Telehealth: Payer: Self-pay | Admitting: Interventional Cardiology

## 2022-02-13 DIAGNOSIS — Z79899 Other long term (current) drug therapy: Secondary | ICD-10-CM

## 2022-02-13 DIAGNOSIS — I10 Essential (primary) hypertension: Secondary | ICD-10-CM

## 2022-02-13 MED ORDER — LOSARTAN POTASSIUM 50 MG PO TABS: 50.0000 mg | ORAL_TABLET | Freq: Every day | ORAL | Status: DC

## 2022-02-13 MED ORDER — FUROSEMIDE 20 MG PO TABS: 20.0000 mg | ORAL_TABLET | Freq: Every day | ORAL | Status: DC

## 2022-02-13 MED ORDER — LOSARTAN POTASSIUM 50 MG PO TABS: 25.0000 mg | ORAL_TABLET | Freq: Every day | ORAL | Status: DC

## 2022-02-13 NOTE — Addendum Note (Signed)
Addended by: Molli Barrows on: 02/13/2022 04:23 PM   Modules accepted: Orders

## 2022-02-13 NOTE — Telephone Encounter (Signed)
Returned call to patient's daughter Kendrick Fries.  She wanted to clarify the medication changes recommended. She reports patient had already been taking Losartan 50mg  QD for the past few months, will reduce by half to 25mg  daily.  Medication list updated.  Discussed notifying our office if patient experiences any weight gain, swelling or SOB  after making these medication changes. Kendrick Fries verbalized understanding.

## 2022-02-13 NOTE — Telephone Encounter (Signed)
-----  Message from Belva Crome, MD sent at 01/09/2022 12:38 PM EST ----- Let the patient know that kidney function has worsened.  Decrease losartan to 50 mg/day.  Change furosemide to 20 mg/day.  Monitor weight, surveillance for recurrence of lower extremity edema, and monitor breathing.  Basic metabolic panel in 2 weeks. A copy will be sent to Stacie Glaze, DO

## 2022-02-13 NOTE — Telephone Encounter (Signed)
Spoke with patient's daughter, Kendrick Fries ([place] per [facility]).  Discussed lab results and Dr. Thompson Caul recommendations: Let the patient know that kidney function has worsened. Decrease losartan to 50 mg/day. Change furosemide to 20 mg/day. Monitor weight, surveillance for recurrence of lower extremity edema, and monitor breathing. Basic metabolic panel in 2 weeks.   Medication list updated.  BMET ordered (under Dr. Oval Linsey who patient will follow-up with in September or sooner if needed).  Lab appointment scheduled for 02/28/2022.  Kendrick Fries verbalized understanding and expressed appreciation for call.

## 2022-02-13 NOTE — Telephone Encounter (Signed)
Patient's daughter calling back to verify the medication changes again.

## 2022-02-28 ENCOUNTER — Ambulatory Visit: Payer: Medicare Other | Attending: Cardiology

## 2022-02-28 DIAGNOSIS — I10 Essential (primary) hypertension: Secondary | ICD-10-CM

## 2022-02-28 DIAGNOSIS — Z79899 Other long term (current) drug therapy: Secondary | ICD-10-CM

## 2022-03-01 LAB — BASIC METABOLIC PANEL
BUN/Creatinine Ratio: 27 (ref 12–28)
BUN: 44 mg/dL — ABNORMAL HIGH (ref 10–36)
CO2: 21 mmol/L (ref 20–29)
Calcium: 9.3 mg/dL (ref 8.7–10.3)
Chloride: 104 mmol/L (ref 96–106)
Creatinine, Ser: 1.66 mg/dL — ABNORMAL HIGH (ref 0.57–1.00)
Glucose: 98 mg/dL (ref 70–99)
Potassium: 4.6 mmol/L (ref 3.5–5.2)
Sodium: 141 mmol/L (ref 134–144)
eGFR: 28 mL/min/{1.73_m2} — ABNORMAL LOW (ref >59)

## 2022-03-07 ENCOUNTER — Telehealth (HOSPITAL_BASED_OUTPATIENT_CLINIC_OR_DEPARTMENT_OTHER): Payer: Self-pay

## 2022-03-07 NOTE — Telephone Encounter (Addendum)
Results called to patient who verbalizes understanding! Scheduled patient one month follow up with Laurann Montana, NP with plan to establish with Dr. Oval Linsey when she returns. Appointment reminder mailed to patient as requested.    ----- Message from Loel Dubonnet, NP sent at 03/06/2022 10:21 PM EST ----- Kidney function improved to baseline. Previous patient of Dr. Tamala Julian. Would recommend follow up visit within the next month or so to ensure her volume status and blood pressure remain at goal.Could be with Dr. Oval Linsey, APP at [facility], or APP at [facility].

## 2022-03-14 ENCOUNTER — Other Ambulatory Visit: Payer: Self-pay | Admitting: *Deleted

## 2022-03-31 ENCOUNTER — Telehealth (HOSPITAL_BASED_OUTPATIENT_CLINIC_OR_DEPARTMENT_OTHER): Payer: Self-pay | Admitting: Family

## 2022-03-31 NOTE — Telephone Encounter (Signed)
Patient aware of appointment change for Morgan Montana, NP (provider not in office 04/07/22) new appointment is 04/10/22 at 1:55pm.  Will mail information to patient and appointment was confirmed

## 2022-04-07 ENCOUNTER — Ambulatory Visit (HOSPITAL_BASED_OUTPATIENT_CLINIC_OR_DEPARTMENT_OTHER): Payer: Medicare Other | Admitting: Family

## 2022-04-10 ENCOUNTER — Encounter (HOSPITAL_BASED_OUTPATIENT_CLINIC_OR_DEPARTMENT_OTHER): Payer: Self-pay | Admitting: Family

## 2022-04-10 ENCOUNTER — Ambulatory Visit (INDEPENDENT_AMBULATORY_CARE_PROVIDER_SITE_OTHER): Payer: Medicare Other | Admitting: Family

## 2022-04-10 VITALS — BP 144/80 | HR 69 | Ht 62.0 in

## 2022-04-10 DIAGNOSIS — N1831 Chronic kidney disease, stage 3a: Secondary | ICD-10-CM

## 2022-04-10 DIAGNOSIS — I10 Essential (primary) hypertension: Secondary | ICD-10-CM

## 2022-04-10 DIAGNOSIS — I5032 Chronic diastolic (congestive) heart failure: Secondary | ICD-10-CM

## 2022-04-10 NOTE — Progress Notes (Signed)
Office Visit    Patient Name: Morgan Harrell Date of Encounter: 04/13/2022  PCP:  Clovia Cuff, MD   [place]  Cardiologist:  Skeet Latch, MD  Advanced Practice Provider:  No care team member to display Electrophysiologist:  None      Chief Complaint    Morgan Harrell is a 89 y.o. female presents today for heart failure follow up   Past Medical History    Past Medical History:  Diagnosis Date   GERD (gastroesophageal reflux disease)    HLD (hyperlipidemia)    HTN (hypertension)    Hypothyroidism    Past Surgical History:  Procedure Laterality Date   ABDOMINAL HYSTERECTOMY     Colon cancer     1996    Allergies  Allergies  Allergen Reactions   Codeine Nausea And Vomiting    History of Present Illness    Morgan Harrell is a 89 y.o. female with a hx of recurrent pulmonary embolism (most recent 01/2018), CKDIII, HTN, HLD, colon cancer 1996 (s/p surgical intervention) bilateral severe knee ostial arthritis, GERD, hypothyroidism, chronic diastolic heart failure last seen 01/08/22 by Dr. Tamala Julian.  Last seen 01/08/22 doing well. No changes were made. Subsequent labs with creatinine 2.06. Losartan and Lasix were reduced. Follow up labs with creatinine 1.66 which is his baseline.   She presents today with her daughter. Notes she has been having difficulties with her left knee and has been working with physical therapy. She seeks Dr. Amil Amen next week. Her blood pressure at home has been in the 130s/70s. Weight has been fluctuating about a pound at home between 190 and 191 lbs. Since decreased dose of Lasix not going o the bathroom quite as often but no edema nor new dyspnea. No chest pain, pressure, tightness.   EKGs/Labs/Other Studies Reviewed:   The following studies were reviewed today: Cardiac Studies & Procedures       ECHOCARDIOGRAM  ECHOCARDIOGRAM COMPLETE 02/03/2020  Narrative ECHOCARDIOGRAM REPORT    Patient Name:   Morgan Harrell Date of Exam: 02/03/2020 Medical Rec #:  [id number]      Height:       66.0 in Accession #:    [id number]     Weight:       168.0 lb Date of Birth:  09/25/1924      BSA:          1.857 m Patient Age:    57 years       BP:           124/52 mmHg Patient Gender: F              HR:           58 bpm. Exam Location:  [place]  Procedure: 2D Echo, Cardiac Doppler and Color Doppler  Indications:    R06.00 Dyspnea  History:        Patient has prior history of Echocardiogram examinations, most recent 02/19/2018. CHF, Signs/Symptoms:Shortness of Breath; Risk Factors:Hypertension and Dyslipidemia. Irregular heartbeat. Pulmonary embolism. Hypothyroidism. Fatigue. Chronic kidney disease. Acute respiratory failure.  Sonographer:    Diamond Nickel RCS Referring Phys: [place]   1. Left ventricular ejection fraction, by estimation, is 60 to 65%. The left ventricle has normal function. The left ventricle has no regional wall motion abnormalities. Left ventricular diastolic parameters are indeterminate. Elevated left ventricular end-diastolic pressure. 2. Right ventricular systolic function is normal. The right ventricular size is normal. There is normal pulmonary  artery systolic pressure. 3. Right atrial size was mildly dilated. 4. The mitral valve is normal in structure. Trivial mitral valve regurgitation. No evidence of mitral stenosis. 5. The aortic valve is tricuspid. There is mild calcification of the aortic valve. There is mild thickening of the aortic valve. Aortic valve regurgitation is not visualized. No aortic stenosis is present. 6. The inferior vena cava is normal in size with greater than 50% respiratory variability, suggesting right atrial pressure of 3 mmHg.  FINDINGS Left Ventricle: Left ventricular ejection fraction, by estimation, is 60 to 65%. The left ventricle has normal function. The left ventricle has no regional wall motion abnormalities. The left  ventricular internal cavity size was normal in size. There is no left ventricular hypertrophy. Left ventricular diastolic parameters are indeterminate. Elevated left ventricular end-diastolic pressure.  Right Ventricle: The right ventricular size is normal. No increase in right ventricular wall thickness. Right ventricular systolic function is normal. There is normal pulmonary artery systolic pressure. The tricuspid regurgitant velocity is 1.31 m/s, and with an assumed right atrial pressure of 3 mmHg, the estimated right ventricular systolic pressure is 9.9 mmHg.  Left Atrium: Left atrial size was normal in size.  Right Atrium: Right atrial size was mildly dilated.  Pericardium: There is no evidence of pericardial effusion.  Mitral Valve: The mitral valve is normal in structure. Trivial mitral valve regurgitation. No evidence of mitral valve stenosis.  Tricuspid Valve: The tricuspid valve is normal in structure. Tricuspid valve regurgitation is trivial. No evidence of tricuspid stenosis.  Aortic Valve: The aortic valve is tricuspid. There is mild calcification of the aortic valve. There is mild thickening of the aortic valve. Aortic valve regurgitation is not visualized. No aortic stenosis is present.  Pulmonic Valve: The pulmonic valve was normal in structure. Pulmonic valve regurgitation is not visualized. No evidence of pulmonic stenosis.  Aorta: The aortic root is normal in size and structure.  Venous: The inferior vena cava is normal in size with greater than 50% respiratory variability, suggesting right atrial pressure of 3 mmHg.  IAS/Shunts: No atrial level shunt detected by color flow Doppler.   LEFT VENTRICLE PLAX 2D LVIDd:         4.20 cm  Diastology LVIDs:         2.50 cm  LV e' medial:    6.06 cm/s LV PW:         1.20 cm  LV E/e' medial:  18.8 LV IVS:        0.80 cm  LV e' lateral:   8.18 cm/s LVOT diam:     1.95 cm  LV E/e' lateral: 13.9 LV SV:         87 LV SV Index:    47 LVOT Area:     2.99 cm   RIGHT VENTRICLE RV Basal diam:  2.20 cm RV S prime:     9.62 cm/s TAPSE (M-mode): 1.7 cm  LEFT ATRIUM             Index       RIGHT ATRIUM           Index LA diam:        3.90 cm 2.10 cm/m  RA Area:     21.50 cm LA Vol (A2C):   53.1 ml 28.59 ml/m RA Volume:   56.70 ml  30.53 ml/m LA Vol (A4C):   43.1 ml 23.21 ml/m LA Biplane Vol: 47.9 ml 25.79 ml/m AORTIC VALVE LVOT Vmax:   122.00 cm/s  LVOT Vmean:  77.100 cm/s LVOT VTI:    0.292 m  AORTA Ao Root diam: 2.50 cm  MITRAL VALVE                TRICUSPID VALVE MV Area (PHT): 3.46 cm     TR Peak grad:   6.9 mmHg MV Decel Time: 219 msec     TR Vmax:        131.00 cm/s MV E velocity: 113.67 cm/s MV A velocity: 62.37 cm/s   SHUNTS MV E/A ratio:  1.82         Systemic VTI:  0.29 m Systemic Diam: 1.95 cm  Skeet Latch MD Electronically signed by Skeet Latch MD Signature Date/Time: 02/03/2020/4:18:36 PM    Final             EKG:  EKG is not ordered today.   Recent Labs: 01/08/2022: Hemoglobin 12.5; NT-Pro BNP 1,198; Platelets 221 02/28/2022: BUN 44; Creatinine, Ser 1.66; Potassium 4.6; Sodium 141  Recent Lipid Panel    Component Value Date/Time   CHOL 119 02/19/2018 0320   TRIG 113 02/19/2018 0320   HDL 37 (L) 02/19/2018 0320   CHOLHDL 3.2 02/19/2018 0320   VLDL 23 02/19/2018 0320   LDLCALC 59 02/19/2018 0320     Home Medications   Current Meds  Medication Sig   albuterol (PROVENTIL HFA;VENTOLIN HFA) 108 (90 Base) MCG/ACT inhaler Inhale 2 puffs into the lungs every 4 (four) hours as needed for wheezing or shortness of breath.   apixaban (ELIQUIS) 5 MG TABS tablet Take 5 mg by mouth 2 (two) times daily.   Cyanocobalamin (VITAMIN B-12 PO) Take by mouth.   dapagliflozin propanediol (FARXIGA) 5 MG TABS tablet Take 1 tablet (5 mg total) by mouth daily before breakfast.   diclofenac Sodium (VOLTAREN) 1 % GEL diclofenac 1 % topical gel   fluticasone (FLONASE) 50 MCG/ACT nasal spray  Place 2 sprays into both nostrils as needed.   fluticasone (FLONASE) 50 MCG/ACT nasal spray 1 spray in each nostril Nasally Once a day for 30 day(s)   furosemide (LASIX) 20 MG tablet Take 1 tablet (20 mg total) by mouth daily.   gabapentin (NEURONTIN) 100 MG capsule 1 capsule Orally three a day   ketorolac (ACULAR) 0.5 % ophthalmic solution as needed.   levothyroxine (SYNTHROID) 50 MCG tablet Take 1 tablet by mouth daily.   loratadine (CLARITIN) 10 MG tablet Take 10 mg by mouth daily.   losartan (COZAAR) 50 MG tablet Take 0.5 tablets (25 mg total) by mouth daily.   meclizine (ANTIVERT) 25 MG tablet Take 25 mg by mouth daily as needed for dizziness (vertigo).   metoprolol tartrate (LOPRESSOR) 25 MG tablet Take 25 mg by mouth 2 (two) times daily.   Multiple Vitamins-Minerals (MEGA MULTIVITAMIN FOR WOMEN) TABS Take by mouth.   triamcinolone (KENALOG) 0.025 % ointment triamcinolone acetonide 0.025 % topical ointment  APPLY THIN LAYER TOPICALLY TO THE AFFECTED AREA TWICE DAILY     Review of Systems      All other systems reviewed and are otherwise negative except as noted above.  Physical Exam    VS:  BP (!) 144/80   Pulse 69   Ht 5\' 2"  (1.575 m)   BMI 35.04 kg/m  , BMI Body mass index is 35.04 kg/m.  Wt Readings from Last 3 Encounters:  01/08/22 191 lb 9.6 oz (86.9 kg)  07/08/21 184 lb 6.4 oz (83.6 kg)  06/07/21 187 lb 6.4 oz (85 kg)  GEN: Well nourished, well developed, in no acute distress. HEENT: normal. Neck: Supple, no JVD, carotid bruits, or masses. Cardiac: RRR, no murmurs, rubs, or gallops. No clubbing, cyanosis, edema.  Radials/PT 2+ and equal bilaterally.  Respiratory:  Respirations regular and unlabored, clear to auscultation bilaterally. GI: Soft, nontender, nondistended. MS: No deformity or atrophy. Skin: Warm and dry, no rash. Neuro:  Strength and sensation are intact. Psych: Normal affect.  Assessment & Plan    HFpEF - Euvolemic and well compensated on  exam. Volume status has been stable even with reduced dose Lasix. Continue lasix 20mg  QD with additional 20mg  PRN for weight gain 2 lbs overnight or 5 lbs in one week. Continue Losartan, [place].   CKD - Renal function improved since reduced dose Losartan and Lasix. Careful titration of diuretic and antihypertensive.    HTN - BP well controlled. Continue current antihypertensive regimen.  Discussed to monitor BP at home at least 2 hours after medications and sitting for 5-10 minutes.   Hypothyroidism - Continue to follow with PCP.      Disposition: Follow up in 6 month(s) with Skeet Latch, MD or APP.  Signed, Loel Dubonnet, NP 04/13/2022, 10:00 PM [place]

## 2022-04-10 NOTE — Patient Instructions (Addendum)
Medication Instructions:  Your physician recommends that you continue on your current medications as directed. Please refer to the Current Medication list given to you today.   If you gain 2lbs overnight or 5lbs in one week you may take two of your Lasix in the am. Please call us if your blood pressure is consistently staying greater than 135.   *If you need a refill on your cardiac medications before your next appointment, please call your pharmacy*  Follow-Up: At [facility], you and your health needs are our priority.  As part of our continuing mission to provide you with exceptional heart care, we have created designated Provider Care Teams.  These Care Teams include your primary Cardiologist (physician) and Advanced Practice Providers (APPs -  Physician Assistants and Nurse Practitioners) who all work together to provide you with the care you need, when you need it.  We recommend signing up for the patient portal called "MyChart".  Sign up information is provided on this After Visit Summary.  MyChart is used to connect with patients for Virtual Visits (Telemedicine).  Patients are able to view lab/test results, encounter notes, upcoming appointments, etc.  Non-urgent messages can be sent to your provider as well.   To learn more about what you can do with MyChart, go to [email or web address].    Your next appointment:   6 month(s)  Provider:   Skeet Latch, MD   Other Instructions Some good primary care providers are: Glendale Chard, MD at [facility] or Dr. Billey Gosling, Dr. Scarlette Calico at [facility]  ________________________  Recommend weighing daily and keeping a log. Please call our office if you have weight gain of 2 pounds overnight or 5 pounds in 1 week.   Date  Time Weight

## 2022-05-06 ENCOUNTER — Ambulatory Visit (INDEPENDENT_AMBULATORY_CARE_PROVIDER_SITE_OTHER): Payer: Medicare Other | Admitting: Podiatry

## 2022-05-06 ENCOUNTER — Encounter: Payer: Self-pay | Admitting: Podiatry

## 2022-05-06 DIAGNOSIS — B351 Tinea unguium: Secondary | ICD-10-CM | POA: Diagnosis not present

## 2022-05-06 DIAGNOSIS — D689 Coagulation defect, unspecified: Secondary | ICD-10-CM

## 2022-05-06 DIAGNOSIS — M79674 Pain in right toe(s): Secondary | ICD-10-CM | POA: Diagnosis not present

## 2022-05-06 DIAGNOSIS — L84 Corns and callosities: Secondary | ICD-10-CM

## 2022-05-06 DIAGNOSIS — M79675 Pain in left toe(s): Secondary | ICD-10-CM

## 2022-05-10 NOTE — Progress Notes (Signed)
  Subjective:  Patient ID: Morgan Harrell, female    DOB: 17-Dec-1924,  MRN: [id number]  Kmora Korell presents to clinic today for at risk foot care with h/o coagulation defect and corn(s) both feet, callus(es) both feet and painful mycotic nails.  Pain interferes with ambulation. Aggravating factors include wearing enclosed shoe gear. Painful toenails interfere with ambulation. Aggravating factors include wearing enclosed shoe gear. Pain is relieved with periodic professional debridement. Painful corns and calluses are aggravated when weightbearing with and without shoegear. Pain is relieved with periodic professional debridement.  Chief Complaint  Patient presents with   Nail Problem    [facility] PCP-[facility] PCP VST-03/2022   New problem(s): None.   She has purchased [employer] shoe gear and is pleased with fit/comfort.   PCP is Annita Brod, MD.  Allergies  Allergen Reactions   Codeine Nausea And Vomiting    Review of Systems: Negative except as noted in the HPI.  Objective: No changes noted in today's physical examination. There were no vitals filed for this visit. Leiha Meenach is a pleasant 89 y.o. female WD, WN in NAD. AAO x 3.  Vascular Examination: Vascular with faintly palpable pedal pulses. Pedal hair absent b/l. CFT <3seconds b/l. No edema. No pain with calf compression b/l. Skin temperature gradient WNL b/l. Evidence of chronic venous insufficiency b/l LE.  Neurological Examination: Sensation grossly intact b/l with 10 gram monofilament. Vibratory sensation intact b/l.   Dermatological Examination: Pedal skin with normal turgor, texture and tone b/l. Toenails 1-5 b/l thick, discolored, elongated with subungual debris and pain on dorsal palpation.   Hyperkeratotic lesion(s) dorsal PIPJ of bilateral 5th toes. dorsal DIPJ right 5th toe, submet head 1 right foot, submet head 4 right foot, and submet head 5 b/l.  No erythema, no edema, no drainage, no  fluctuance.  Musculoskeletal Examination: Muscle strength 5/5 to b/l LE. HAV with bunion bilaterally and hammertoes 2-5 b/l. She continues to wear Germaine Pomfret type shoes with leather uppers which do not accommodate her hammertoe deformities.  Radiographs: None  Assessment/Plan: 1. Pain due to onychomycosis of toenails of both feet   2. Corns and callosities   3. Coagulation defect     -Consent given for treatment as described below: -Examined patient. -Patient rececenly purchased [employer] shoes which are more comfortable for her corns/calluses. -Continue supportive shoe gear daily. -Mycotic toenails 1-5 bilaterally were debrided in length and girth with sterile nail nippers and dremel without incident. -Corn(s) dorsal DIPJ of R 5th toe and dorsal PIPJ of bilateral 5th toes and callus(es) submet head 1 right foot and submet head 5 b/l were pared utilizing sterile scalpel blade without incident. Total number debrided =3. -Patient/POA to call should there be question/concern in the interim.   Return in about 3 months (around 08/05/2022).  Freddie Breech, DPM

## 2022-05-16 IMAGING — DX DG CHEST 2V
2 series · 2 of 2 positions shown · non-contrast
Comparison: February 19, 2018.

CLINICAL DATA: Dyspnea on exertion.

EXAM:
CHEST - 2 VIEW

[chest pa]
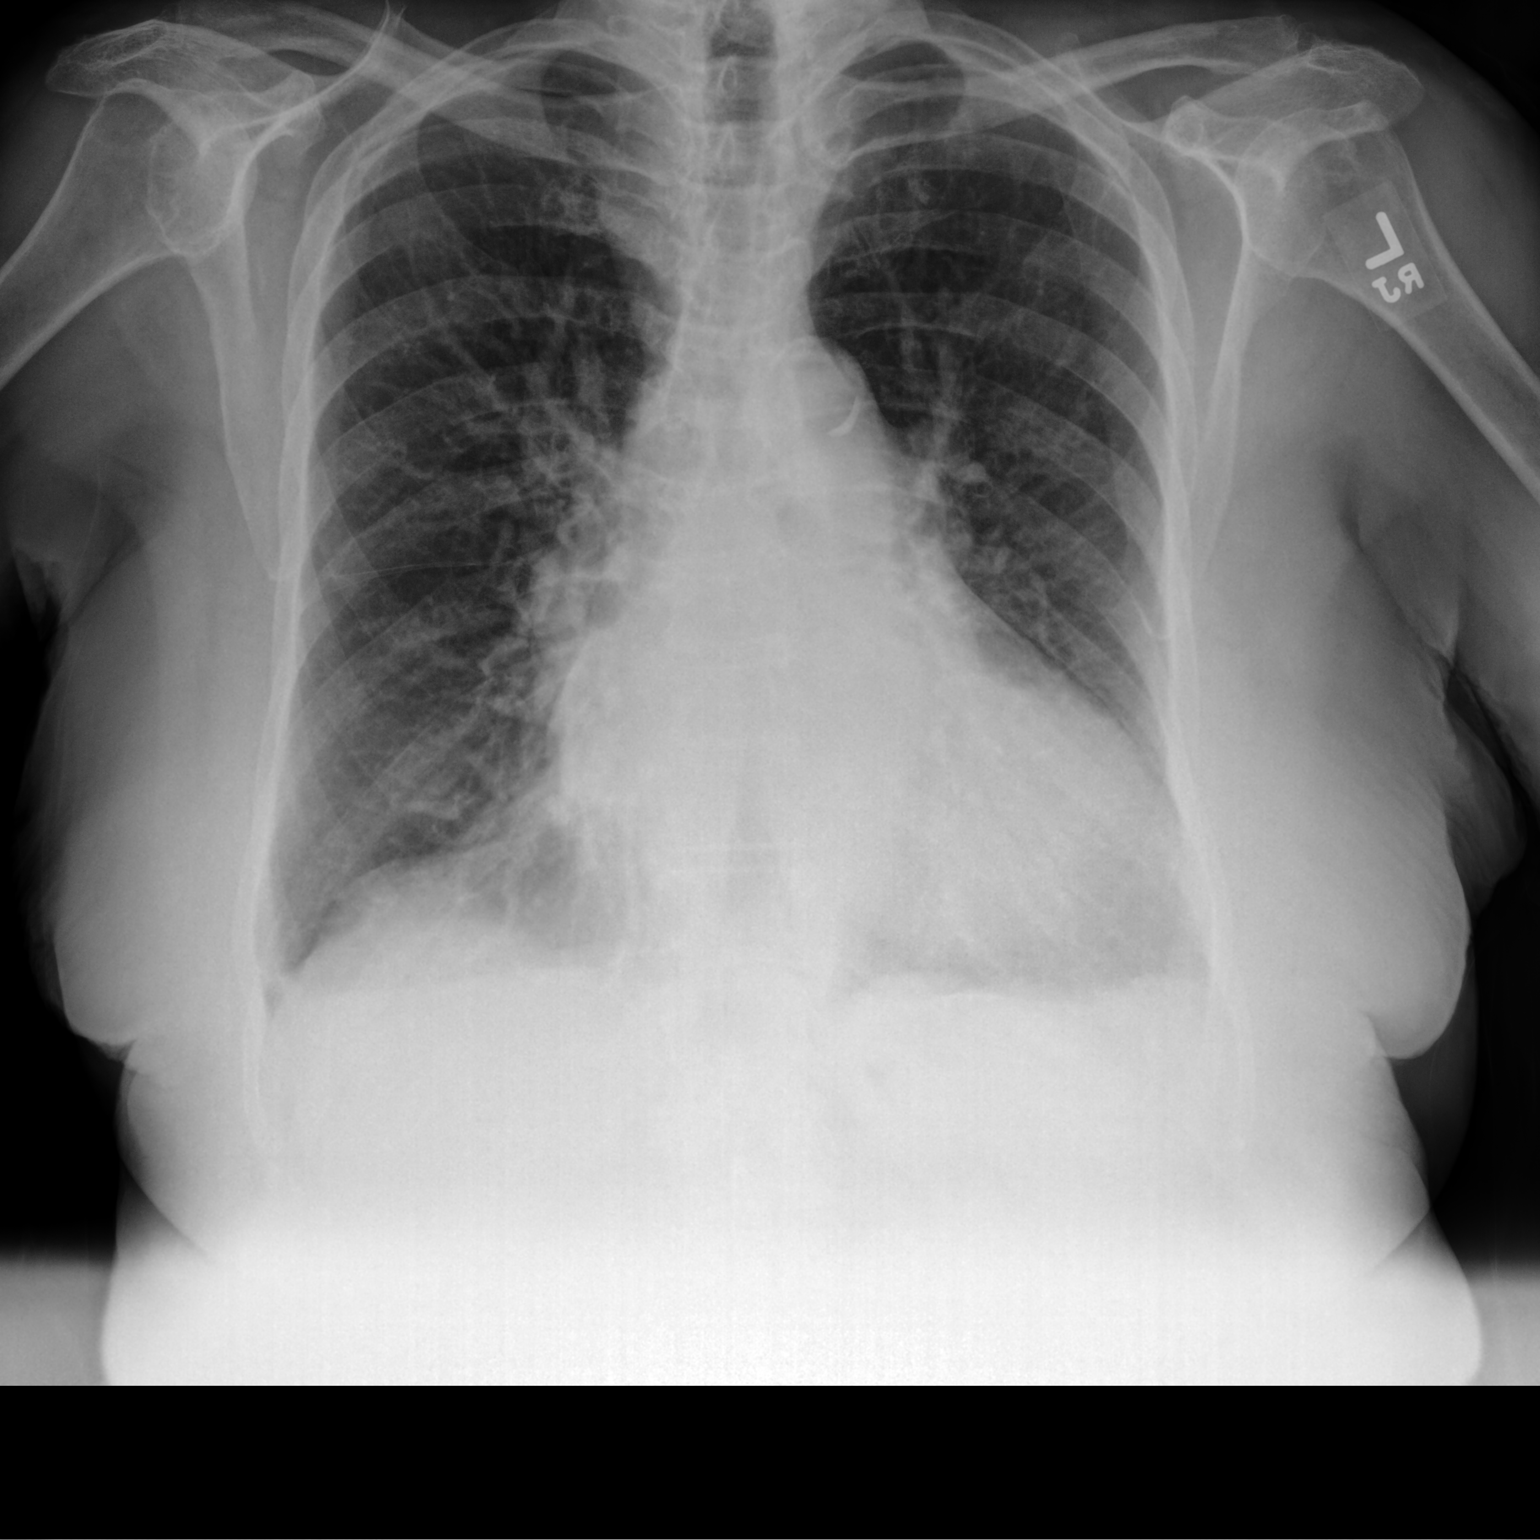

[chest lat]
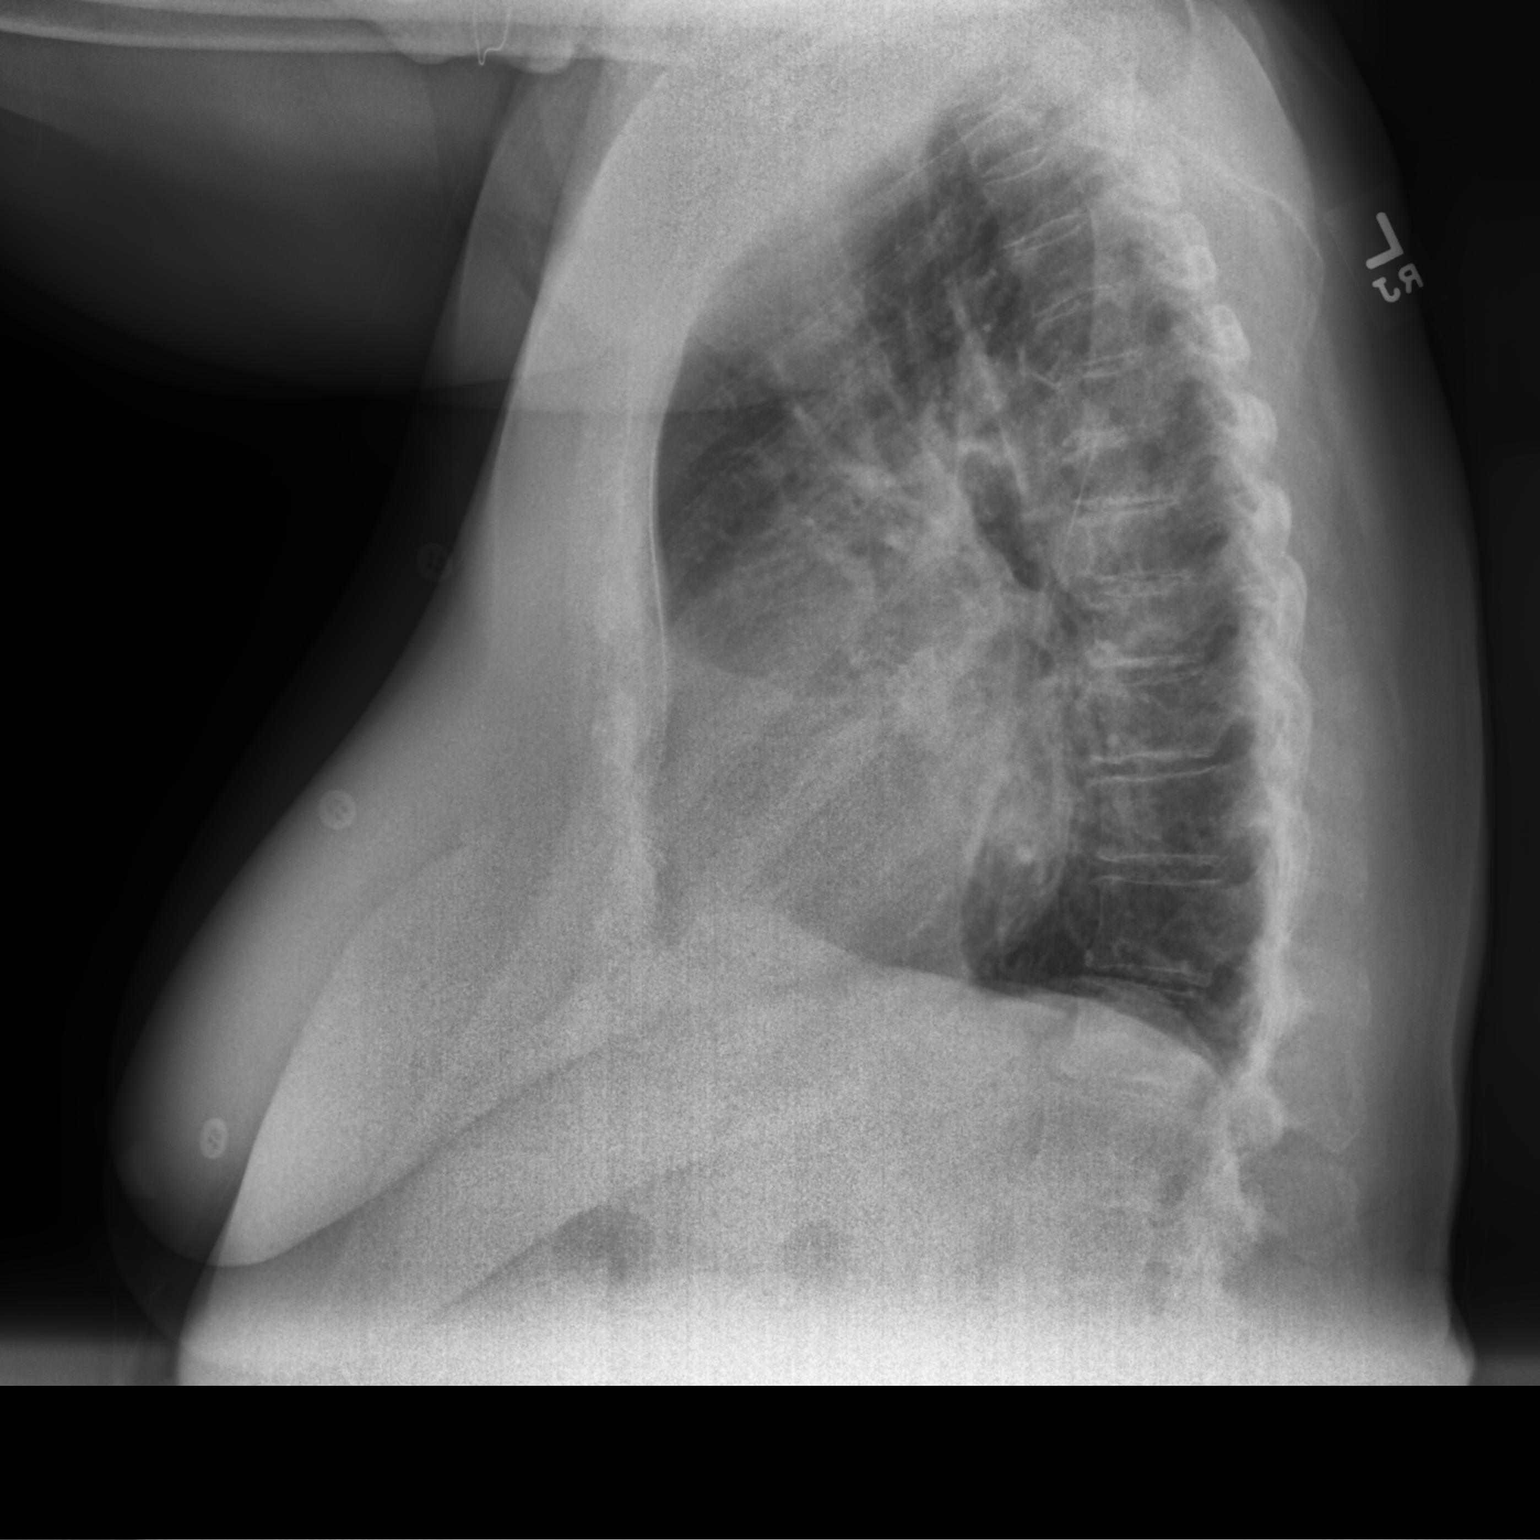

[2 of 2 positions shown; findings below may reference images not displayed]

FINDINGS: Stable cardiomegaly. Both lungs are clear. The visualized skeletal
structures are unremarkable.
IMPRESSION: No active cardiopulmonary disease.

Aortic Atherosclerosis ([id number]-K1N.N).

## 2022-06-09 ENCOUNTER — Other Ambulatory Visit: Payer: Self-pay

## 2022-06-09 MED ORDER — FUROSEMIDE 20 MG PO TABS: 20.0000 mg | ORAL_TABLET | Freq: Every day | ORAL | 3 refills | Status: DC

## 2022-09-10 ENCOUNTER — Ambulatory Visit (INDEPENDENT_AMBULATORY_CARE_PROVIDER_SITE_OTHER): Payer: Medicare Other | Admitting: Podiatry

## 2022-09-10 ENCOUNTER — Encounter: Payer: Self-pay | Admitting: Podiatry

## 2022-09-10 DIAGNOSIS — M79675 Pain in left toe(s): Secondary | ICD-10-CM

## 2022-09-10 DIAGNOSIS — D689 Coagulation defect, unspecified: Secondary | ICD-10-CM | POA: Diagnosis not present

## 2022-09-10 DIAGNOSIS — M79674 Pain in right toe(s): Secondary | ICD-10-CM

## 2022-09-10 DIAGNOSIS — B351 Tinea unguium: Secondary | ICD-10-CM

## 2022-09-10 DIAGNOSIS — L84 Corns and callosities: Secondary | ICD-10-CM | POA: Diagnosis not present

## 2022-09-15 NOTE — Progress Notes (Signed)
  Subjective:  Patient ID: Morgan Harrell, female    DOB: 19-Dec-1924,  MRN: [id number]  Morgan Harrell presents to clinic today for at risk foot care with h/o clotting disorder and corn(s) of both feet, callus(es) of both feet and painful mycotic nails.  Pain interferes with ambulation. Aggravating factors include wearing enclosed shoe gear. Painful toenails interfere with ambulation. Aggravating factors include wearing enclosed shoe gear. Pain is relieved with periodic professional debridement. Painful corns and calluses are aggravated when weightbearing with and without shoegear. Pain is relieved with periodic professional debridement.   New problem(s): None.   PCP is Annita Brod, MD.  Allergies  Allergen Reactions   Codeine Nausea And Vomiting    Review of Systems: Negative except as noted in the HPI.  Objective: No changes noted in today's physical examination. There were no vitals filed for this visit. Morgan Harrell is a pleasant 89 y.o. female WD, WN in NAD. AAO x 3.  Vascular Examination: CFT <3 seconds b/l. DP/PT pulses faintly palpable b/l. Skin temperature gradient warm to warm b/l. No pain with calf compression. No ischemia or gangrene. No cyanosis or clubbing noted b/l. Pedal hair absent.   Neurological Examination: Sensation grossly intact b/l with 10 gram monofilament. Vibratory sensation intact b/l.   Dermatological Examination: Pedal skin warm and supple b/l.   No open wounds. No interdigital macerations.  Toenails 1-5 b/l thick, discolored, elongated with subungual debris and pain on dorsal palpation.    Hyperkeratotic lesion(s) bilateral 5th toes, 3-5 right foot, submet head 1 right foot, submet head 3 right foot, and submet head 5 left foot.  No erythema, no edema, no drainage, no fluctuance.  Musculoskeletal Examination: Muscle strength 5/5 to all lower extremity muscle groups bilaterally. HAV with bunion deformity noted b/l LE. Hammertoe deformity noted 2-5  b/l. Utilizes transport chair for ambulation assistance.  Radiographs: None  Assessment/Plan: 1. Pain due to onychomycosis of toenails of both feet   2. Corns and callosities   3. Coagulation defect [facility])    -Patient's family member present. All questions/concerns addressed on today's visit. -Patient to continue soft, supportive shoe gear daily. -Toenails 1-5 b/l were debrided in length and girth with sterile nail nippers and dremel without iatrogenic bleeding.  -Corn(s) bilateral 5th toes and 3-5 right foot and callus(es) submet head 1 right foot, submet head 3 right foot, and submet head 5 left foot were pared utilizing sterile scalpel blade without incident. Total number debrided =8. -Patient/POA to call should there be question/concern in the interim.   Return in about 3 months (around 12/11/2022).  Freddie Breech, DPM

## 2022-10-15 ENCOUNTER — Other Ambulatory Visit: Payer: Self-pay

## 2022-10-15 ENCOUNTER — Other Ambulatory Visit (HOSPITAL_COMMUNITY): Payer: Self-pay

## 2022-10-15 ENCOUNTER — Telehealth: Payer: Self-pay | Admitting: Pharmacy Technician

## 2022-10-15 ENCOUNTER — Ambulatory Visit (HOSPITAL_BASED_OUTPATIENT_CLINIC_OR_DEPARTMENT_OTHER): Payer: Medicare Other | Admitting: Cardiovascular Disease

## 2022-10-15 ENCOUNTER — Encounter (HOSPITAL_BASED_OUTPATIENT_CLINIC_OR_DEPARTMENT_OTHER): Payer: Self-pay | Admitting: Cardiovascular Disease

## 2022-10-15 VITALS — BP 152/68 | HR 60 | Ht 62.0 in | Wt 184.5 lb

## 2022-10-15 DIAGNOSIS — I50811 Acute right heart failure: Secondary | ICD-10-CM | POA: Diagnosis not present

## 2022-10-15 DIAGNOSIS — G4733 Obstructive sleep apnea (adult) (pediatric): Secondary | ICD-10-CM | POA: Diagnosis not present

## 2022-10-15 DIAGNOSIS — I2693 Single subsegmental pulmonary embolism without acute cor pulmonale: Secondary | ICD-10-CM

## 2022-10-15 DIAGNOSIS — I10 Essential (primary) hypertension: Secondary | ICD-10-CM | POA: Diagnosis not present

## 2022-10-15 DIAGNOSIS — E782 Mixed hyperlipidemia: Secondary | ICD-10-CM

## 2022-10-15 DIAGNOSIS — N1831 Chronic kidney disease, stage 3a: Secondary | ICD-10-CM

## 2022-10-15 MED ORDER — APIXABAN 2.5 MG PO TABS: 2.5000 mg | ORAL_TABLET | Freq: Two times a day (BID) | ORAL | 3 refills | Status: DC

## 2022-10-15 MED ORDER — FUROSEMIDE 20 MG PO TABS: 10.0000 mg | ORAL_TABLET | Freq: Every day | ORAL | Status: DC

## 2022-10-15 MED ORDER — FARXIGA 5 MG PO TABS: 5.0000 mg | ORAL_TABLET | Freq: Every day | ORAL | 3 refills | Status: DC

## 2022-10-15 MED ORDER — LOSARTAN POTASSIUM 50 MG PO TABS: 50.0000 mg | ORAL_TABLET | Freq: Every day | ORAL | Status: DC

## 2022-10-15 MED ORDER — FARXIGA 5 MG PO TABS: 5.0000 mg | ORAL_TABLET | Freq: Every day | ORAL | 6 refills | Status: DC

## 2022-10-15 MED ORDER — DAPAGLIFLOZIN PROPANEDIOL 5 MG PO TABS: 5.0000 mg | ORAL_TABLET | Freq: Every day | ORAL | 6 refills | Status: DC

## 2022-10-15 NOTE — Addendum Note (Signed)
Addended by: Aaban Griep E on: 10/15/2022 03:12 PM   Modules accepted: Orders

## 2022-10-15 NOTE — Progress Notes (Signed)
Cardiology Office Note:  .    Date:  10/20/2022  ID:  Morgan Harrell, DOB [birth year], MRN [id number] PCP: Annita Brod, MD  [facility] Providers Cardiologist:  Chilton Si, MD     History of Present Illness: .    Morgan Harrell is a 89 y.o. female with recurrent pulmonary embolism on chronic Eliquis, HFpEF, CKD 3, hypertension, hyperlipidemia, colon cancer status post surgery, severe arthritis, GERD, and hypothyroidism here for follow-up.  She was previously a patient of Dr. Katrinka Blazing.  Her last echo was 01/2020 and LVEF was 60-65% with indeterminate diastolic function.  She has had worsening renal function and losartan and furosemide were both reduced.  She saw [name], NP 03/2022 and was euvolemic and doing well.   Today, she is accompanied by a family member. Recently she had a cold but is feeling better at this time. She complains of swelling in her right first finger, which appears more swollen in the skin rather than the joint. Doesn't recall any recent injuries. She notes having arthritis in her legs and hands. She confirms that her swelling is usually worse in her left leg, but has been present bilaterally in her ankles, legs, and knees. At this time she is taking 1/2 tablet of Lasix (total 10 mg) daily. In the office her blood pressure is 138/78 initially, and 152/68 on manual recheck. She has checked her blood pressure inconsistently at home, but not in the past week. Prior to that her BP was frequently lower than 130/80; one time higher about 2 weeks ago. Currently she states she is taking a full tablet of 50 mg losartan daily. EKG performed today showed sinus rhythm at 60 bpm with incomplete RBBB. She walks for exercise but not very far, and uses a cane for assistance. She denies any falls. If she walks a lot she may feel short of breath. Every day she participates in other exercises, sometimes causing her to feel fatigued so she will rest for a brief time. No anginal  symptoms. Regarding her diet, she prepares some meals at home but does order out frequently. She denies any palpitations, chest pain, lightheadedness, headaches, syncope, orthopnea, or PND.  ROS:  Please see the history of present illness. All other systems are reviewed and negative.  (+) Swelling of right first finger (+) LE edema L>R (+) Arthralgias (+) Exertional shortness of breath (+) Fatigue  Studies Reviewed: Marland Kitchen   EKG Interpretation Date/Time:  Wednesday October 15 2022 15:23:47 EDT Ventricular Rate:  60 PR Interval:  136 QRS Duration:  102 QT Interval:  434 QTC Calculation: 434 R Axis:   -19  Text Interpretation: Normal sinus rhythm with sinus arrhythmia Incomplete right bundle branch block No significant change since last tracing Confirmed by Chilton Si ([id number]) on 10/20/2022 8:23:39 AM    Echo 02/03/20: IMPRESSIONS   1. Left ventricular ejection fraction, by estimation, is 60 to 65%. The  left ventricle has normal function. The left ventricle has no regional  wall motion abnormalities. Left ventricular diastolic parameters are  indeterminate. Elevated left ventricular  end-diastolic pressure.   2. Right ventricular systolic function is normal. The right ventricular  size is normal. There is normal pulmonary artery systolic pressure.   3. Right atrial size was mildly dilated.   4. The mitral valve is normal in structure. Trivial mitral valve  regurgitation. No evidence of mitral stenosis.   5. The aortic valve is tricuspid. There is mild calcification of the  aortic valve. There is mild  thickening of the aortic valve. Aortic valve  regurgitation is not visualized. No aortic stenosis is present.   6. The inferior vena cava is normal in size with greater than 50%  respiratory variability, suggesting right atrial pressure of 3 mmHg.   10/15/22: Sinus rhythm.  Rate 60 bpm.  iRBBB.    Risk Assessment/Calculations:         Physical Exam:    VS:  BP (!) 152/68 (BP  Location: Left Arm, Patient Position: Sitting, Cuff Size: Large)   Pulse 60   Ht 5\' 2"  (1.575 m)   Wt 184 lb 8 oz (83.7 kg)   BMI 33.75 kg/m  , BMI Body mass index is 33.75 kg/m. GENERAL:  Well appearing HEENT: Pupils equal round and reactive, fundi not visualized, oral mucosa unremarkable NECK:  No jugular venous distention, waveform within normal limits, carotid upstroke brisk and symmetric, no bruits, no thyromegaly LUNGS:  Clear to auscultation bilaterally HEART:  RRR.  PMI not displaced or sustained,S1 and S2 within normal limits, no S3, no S4, no clicks, no rubs, no murmurs ABD:  Flat, positive bowel sounds normal in frequency in pitch, no bruits, no rebound, no guarding, no midline pulsatile mass, no hepatomegaly, no splenomegaly EXT:  2 plus pulses throughout, trace LLE edema, no cyanosis no clubbing SKIN:  No rashes no nodules NEURO:  Cranial nerves II through XII grossly intact, motor grossly intact throughout PSYCH:  Cognitively intact, oriented to person place and time  Wt Readings from Last 3 Encounters:  10/15/22 184 lb 8 oz (83.7 kg)  01/08/22 191 lb 9.6 oz (86.9 kg)  07/08/21 184 lb 6.4 oz (83.6 kg)     ASSESSMENT AND PLAN: .    # Hypertension Patient reports taking full tablet of Losartan, but blood pressure readings are inconsistent and sometimes both high and low. -Continue current medication regimen. -Track blood pressure twice daily for a week. -Schedule follow-up with pharmacist or nurse practitioner in a month to adjust medication if necessary.  Avoid hypotension given her age and frailty. -Continue metoprolol and losartan.  # Anticoagulation Patient on Eliquis 5mg  twice daily, but based on age and kidney function, dose should be reduced. -Reduce Eliquis to 2.5mg  twice daily. Patient can split current tablets until she runs out. -Send prescription for Eliquis 2.5mg  to pharmacy for when current supply runs out.  # Lower extremity edema Patient reports  swelling in feet and legs, with left leg swelling more than right. History of blood clots and a fall resulting in a fractured ankle.  Otherwise no evidence of HF.  unlikely DVT given that she is chronically anticoagulated.  -Continue current management and monitor for changes.  # HFpEF: Elevated BNP in December, but patient reports feeling stable. Currently on Furosemide. -Continue current medication regimen. -Schedule follow-up in six months.  General Health Maintenance -Encourage home cooking over restaurant food to control salt intake. -Continue daily physical therapy exercises at home. -Track blood pressure twice daily for a week. -Schedule follow-up with pharmacist or nurse practitioner in a month to adjust medication if necessary.       Dispo:  FU with APP/PharmD in 1 month. FU with Jong Rickman C. Duke Salvia, MD, [facility] in 6 months.  I,Mathew Stumpf,acting as a Neurosurgeon for Chilton Si, MD.,have documented all relevant documentation on the behalf of Chilton Si, MD,as directed by  Chilton Si, MD while in the presence of Chilton Si, MD.  I, Yvan Dority C. Duke Salvia, MD have reviewed all documentation for this visit.  The  documentation of the exam, diagnosis, procedures, and orders on 10/20/2022 are all accurate and complete.   Signed, Chilton Si, MD

## 2022-10-15 NOTE — Telephone Encounter (Signed)
Pharmacy Patient Advocate Encounter   Received notification from Fax that prior authorization for Morgan Harrell is required/requested.   Insurance verification completed.   The patient is insured through [employer] .   Per test claim:  BRAND NAME FARXIGA  30 Day supply $46.00 is preferred by the insurance.  If suggested medication is appropriate, Please send in a new RX and discontinue this one. If not, please advise as to why it's not appropriate so that we may request a Prior Authorization.

## 2022-10-15 NOTE — Telephone Encounter (Signed)
Rx refilled for brand Farxiga with DAW added to rx.

## 2022-10-15 NOTE — Patient Instructions (Addendum)
Medication Instructions:   Decrease Eliquis to 2.5 mg twice day   *If you need a refill on your cardiac medications before your next appointment, please call your pharmacy*   Lab Work:  If you have labs (blood work) drawn today and your tests are completely normal, you will receive your results only by: MyChart Message (if you have MyChart) OR A paper copy in the mail If you have any lab test that is abnormal or we need to change your treatment, we will call you to review the results.   Testing/Procedures:   Follow-Up: At [facility], you and your health needs are our priority.  As part of our continuing mission to provide you with exceptional heart care, we have created designated Provider Care Teams.  These Care Teams include your primary Cardiologist (physician) and Advanced Practice Providers (APPs -  Physician Assistants and Nurse Practitioners) who all work together to provide you with the care you need, when you need it.  We recommend signing up for the patient portal called "MyChart".  Sign up information is provided on this After Visit Summary.  MyChart is used to connect with patients for Virtual Visits (Telemedicine).  Patients are able to view lab/test results, encounter notes, upcoming appointments, etc.  Non-urgent messages can be sent to your provider as well.   To learn more about what you can do with MyChart, go to [email or web address].    Your next appointment:   1 month(s) BP clinic   Provider:   [name], NP or Teresita Madura Pharm D      Other Instructions  Check you BP twice a day and keep a log and bring to your next appointment with you.  Bring your medications with you to the visit.

## 2022-10-15 NOTE — Progress Notes (Deleted)
Cardiology Office Note:  .   Date:  10/15/2022  ID:  Morgan Harrell, DOB Feb 03, 1924, MRN [id number] PCP: Annita Brod, MD  [facility] Providers Cardiologist:  Chilton Si, MD { Click to update primary MD,subspecialty MD or APP then REFRESH:1}   History of Present Illness: .   Morgan Harrell is a 89 y.o. female with recurrent pulmonary embolism on chronic Eliquis, HFpEF, CKD 3, hypertension, hyperlipidemia, colon cancer status post surgery, severe arthritis, GERD, and hypothyroidism here for follow-up.  She was previously a patient of Dr. Katrinka Blazing.  Her last echo was 01/2020 and LVEF was 60-65% with indeterminate diastolic function.  She has had worsening renal function and losartan and furosemide were both reduced.  She saw [name], NP 03/2022 and was euvolemic and doing well.     ROS: ***  Studies Reviewed: .       Echo 02/03/20: IMPRESSIONS     1. Left ventricular ejection fraction, by estimation, is 60 to 65%. The  left ventricle has normal function. The left ventricle has no regional  wall motion abnormalities. Left ventricular diastolic parameters are  indeterminate. Elevated left ventricular  end-diastolic pressure.   2. Right ventricular systolic function is normal. The right ventricular  size is normal. There is normal pulmonary artery systolic pressure.   3. Right atrial size was mildly dilated.   4. The mitral valve is normal in structure. Trivial mitral valve  regurgitation. No evidence of mitral stenosis.   5. The aortic valve is tricuspid. There is mild calcification of the  aortic valve. There is mild thickening of the aortic valve. Aortic valve  regurgitation is not visualized. No aortic stenosis is present.   6. The inferior vena cava is normal in size with greater than 50%  respiratory variability, suggesting right atrial pressure of 3 mmHg.  *** Risk Assessment/Calculations:   {Does this patient have ATRIAL FIBRILLATION?:[id number]} No BP  recorded.  {Refresh Note OR Click here to enter BP  :1}***       Physical Exam:   VS:  There were no vitals taken for this visit.   Wt Readings from Last 3 Encounters:  01/08/22 191 lb 9.6 oz (86.9 kg)  07/08/21 184 lb 6.4 oz (83.6 kg)  06/07/21 187 lb 6.4 oz (85 kg)    GEN: Well nourished, well developed in no acute distress NECK: No JVD; No carotid bruits CARDIAC: ***RRR, no murmurs, rubs, gallops RESPIRATORY:  Clear to auscultation without rales, wheezing or rhonchi  ABDOMEN: Soft, non-tender, non-distended EXTREMITIES:  No edema; No deformity   ASSESSMENT AND PLAN: .   ***    {Are you ordering a CV Procedure (e.g. stress test, cath, DCCV, TEE, etc)?   Press F2        :[id number]}  Dispo: ***  Signed, Chilton Si, MD

## 2022-10-20 ENCOUNTER — Encounter (HOSPITAL_BASED_OUTPATIENT_CLINIC_OR_DEPARTMENT_OTHER): Payer: Self-pay | Admitting: Cardiovascular Disease

## 2022-12-04 ENCOUNTER — Encounter (HOSPITAL_BASED_OUTPATIENT_CLINIC_OR_DEPARTMENT_OTHER): Payer: Self-pay | Admitting: Family

## 2022-12-04 ENCOUNTER — Other Ambulatory Visit (HOSPITAL_BASED_OUTPATIENT_CLINIC_OR_DEPARTMENT_OTHER): Payer: Self-pay

## 2022-12-04 ENCOUNTER — Ambulatory Visit (HOSPITAL_BASED_OUTPATIENT_CLINIC_OR_DEPARTMENT_OTHER): Payer: Medicare Other | Admitting: Family

## 2022-12-04 VITALS — BP 136/68 | HR 60 | Ht 62.0 in | Wt 185.0 lb

## 2022-12-04 DIAGNOSIS — I10 Essential (primary) hypertension: Secondary | ICD-10-CM

## 2022-12-04 DIAGNOSIS — I5032 Chronic diastolic (congestive) heart failure: Secondary | ICD-10-CM | POA: Diagnosis not present

## 2022-12-04 DIAGNOSIS — E782 Mixed hyperlipidemia: Secondary | ICD-10-CM

## 2022-12-04 MED ORDER — FUROSEMIDE 20 MG PO TABS: 20.0000 mg | ORAL_TABLET | Freq: Every day | ORAL | 3 refills | Status: DC

## 2022-12-04 NOTE — Progress Notes (Signed)
Cardiology Office Note:  .   Date:  12/04/2022  ID:  [name], DOB April 17, 1924, MRN [id number] PCP: Annita Brod, MD  [facility] Providers Cardiologist:  Chilton Si, MD    History of Present Illness: .   Camiah Wegman is a 89 y.o. female  with a hx of recurrent pulmonary embolism (most recent 01/2018), CKDIII, HTN, HLD, colon cancer 1996 (s/p surgical intervention), bilateral severe knee ostial arthritis, GERD, hypothyroidism, chronic diastolic heart failure.   Seen 01/08/22 and due to elevated creatinine, Losartan and Lasix were reduced. She was seen 03/2022 euvolemic and doing well.   Last seen 10/15/22 by Dr. Duke Salvia noting some LE edema, dyspnea on exertion, fatigue, and labile blood pressure. She was recommended to continue full tablet of Losartan and track BP twice daily. Eliquis reduced to 2.5mg  based on age, kidney function. At visit with rheumatology 10/23/22 BP 130/68. At home visit with PCP 11/21/22 BP 153/80   She presents today with her daughter.  Doing overall since last seen.  BP is very labile at home with SBP from 110s-160s with no clear pattern. Most often SBP 130-140s. Home BP cuff checked for accuracy today with reading 157/94 and manual reading 136/68.  Reports no chest pain, lightheadedness, edema, orthopnea, PND.  She reports her mild exertional dyspnea is stable at baseline.  Endorses some fatigue which she attributes to age.  Of note she is taking half of the Lasix 40 mg tablet instead of half of 20 mg tablet as is presently on her medication list, will update.  ROS: Please see the history of present illness.    All other systems reviewed and are negative.   Studies Reviewed: .        Cardiac Studies & Procedures       ECHOCARDIOGRAM  ECHOCARDIOGRAM COMPLETE 02/03/2020  Narrative ECHOCARDIOGRAM REPORT    Patient Name:   LECHELLE WRIGLEY Date of Exam: 02/03/2020 Medical Rec #:  [id number]      Height:       66.0 in Accession #:    [id number]      Weight:       168.0 lb Date of Birth:  19-Oct-1924      BSA:          1.857 m Patient Age:    89 years       BP:           124/52 mmHg Patient Gender: F              HR:           58 bpm. Exam Location:  [address]  Procedure: 2D Echo, Cardiac Doppler and Color Doppler  Indications:    R06.00 Dyspnea  History:        Patient has prior history of Echocardiogram examinations, most recent 02/19/2018. CHF, Signs/Symptoms:Shortness of Breath; Risk Factors:Hypertension and Dyslipidemia. Irregular heartbeat. Pulmonary embolism. Hypothyroidism. Fatigue. Chronic kidney disease. Acute respiratory failure.  Sonographer:    Cathie Beams RCS Referring Phys: [phone] Barry Dienes [facility]  IMPRESSIONS   1. Left ventricular ejection fraction, by estimation, is 60 to 65%. The left ventricle has normal function. The left ventricle has no regional wall motion abnormalities. Left ventricular diastolic parameters are indeterminate. Elevated left ventricular end-diastolic pressure. 2. Right ventricular systolic function is normal. The right ventricular size is normal. There is normal pulmonary artery systolic pressure. 3. Right atrial size was mildly dilated. 4. The mitral valve is normal in structure. Trivial mitral valve regurgitation. No  evidence of mitral stenosis. 5. The aortic valve is tricuspid. There is mild calcification of the aortic valve. There is mild thickening of the aortic valve. Aortic valve regurgitation is not visualized. No aortic stenosis is present. 6. The inferior vena cava is normal in size with greater than 50% respiratory variability, suggesting right atrial pressure of 3 mmHg.  FINDINGS Left Ventricle: Left ventricular ejection fraction, by estimation, is 60 to 65%. The left ventricle has normal function. The left ventricle has no regional wall motion abnormalities. The left ventricular internal cavity size was normal in size. There is no left ventricular hypertrophy. Left ventricular  diastolic parameters are indeterminate. Elevated left ventricular end-diastolic pressure.  Right Ventricle: The right ventricular size is normal. No increase in right ventricular wall thickness. Right ventricular systolic function is normal. There is normal pulmonary artery systolic pressure. The tricuspid regurgitant velocity is 1.31 m/s, and with an assumed right atrial pressure of 3 mmHg, the estimated right ventricular systolic pressure is 9.9 mmHg.  Left Atrium: Left atrial size was normal in size.  Right Atrium: Right atrial size was mildly dilated.  Pericardium: There is no evidence of pericardial effusion.  Mitral Valve: The mitral valve is normal in structure. Trivial mitral valve regurgitation. No evidence of mitral valve stenosis.  Tricuspid Valve: The tricuspid valve is normal in structure. Tricuspid valve regurgitation is trivial. No evidence of tricuspid stenosis.  Aortic Valve: The aortic valve is tricuspid. There is mild calcification of the aortic valve. There is mild thickening of the aortic valve. Aortic valve regurgitation is not visualized. No aortic stenosis is present.  Pulmonic Valve: The pulmonic valve was normal in structure. Pulmonic valve regurgitation is not visualized. No evidence of pulmonic stenosis.  Aorta: The aortic root is normal in size and structure.  Venous: The inferior vena cava is normal in size with greater than 50% respiratory variability, suggesting right atrial pressure of 3 mmHg.  IAS/Shunts: No atrial level shunt detected by color flow Doppler.   LEFT VENTRICLE PLAX 2D LVIDd:         4.20 cm  Diastology LVIDs:         2.50 cm  LV e' medial:    6.06 cm/s LV PW:         1.20 cm  LV E/e' medial:  18.8 LV IVS:        0.80 cm  LV e' lateral:   8.18 cm/s LVOT diam:     1.95 cm  LV E/e' lateral: 13.9 LV SV:         87 LV SV Index:   47 LVOT Area:     2.99 cm   RIGHT VENTRICLE RV Basal diam:  2.20 cm RV S prime:     9.62 cm/s TAPSE  (M-mode): 1.7 cm  LEFT ATRIUM             Index       RIGHT ATRIUM           Index LA diam:        3.90 cm 2.10 cm/m  RA Area:     21.50 cm LA Vol (A2C):   53.1 ml 28.59 ml/m RA Volume:   56.70 ml  30.53 ml/m LA Vol (A4C):   43.1 ml 23.21 ml/m LA Biplane Vol: 47.9 ml 25.79 ml/m AORTIC VALVE LVOT Vmax:   122.00 cm/s LVOT Vmean:  77.100 cm/s LVOT VTI:    0.292 m  AORTA Ao Root diam: 2.50 cm  MITRAL VALVE  TRICUSPID VALVE MV Area (PHT): 3.46 cm     TR Peak grad:   6.9 mmHg MV Decel Time: 219 msec     TR Vmax:        131.00 cm/s MV E velocity: 113.67 cm/s MV A velocity: 62.37 cm/s   SHUNTS MV E/A ratio:  1.82         Systemic VTI:  0.29 m Systemic Diam: 1.95 cm  Chilton Si MD Electronically signed by Chilton Si MD Signature Date/Time: 02/03/2020/4:18:36 PM    Final             Risk Assessment/Calculations:             Physical Exam:   VS:  BP 132/74   Pulse 60   Ht 5\' 2"  (1.575 m)   Wt 185 lb (83.9 kg)   SpO2 95%   BMI 33.84 kg/m    Wt Readings from Last 3 Encounters:  12/04/22 185 lb (83.9 kg)  10/15/22 184 lb 8 oz (83.7 kg)  01/08/22 191 lb 9.6 oz (86.9 kg)    GEN: Well nourished, well developed in no acute distress NECK: No JVD; No carotid bruits CARDIAC: RRR, no murmurs, rubs, gallops RESPIRATORY:  Clear to auscultation without rales, wheezing or rhonchi  ABDOMEN: Soft, non-tender, non-distended EXTREMITIES:  No edema; No deformity   ASSESSMENT AND PLAN: .    HFpEF - Euvolemic and well compensated on exam.  Continue Farxiga 5 mg daily, losartan 50 mg daily, metoprolol tartrate 25 mg twice daily, Lasix 20 mg daily.  She is presently taking half of a 40 mg tablet of Lasix and will instead send prescription for the 20 mg tablet.   CKD III - Careful titration of diuretic and antihypertensive.     HTN -given age BP goal less than 140/90.  BP in clinic 132/74.  Home cuff found to read inaccurately, her daughter plans to purchase  new BP cuff.  Continue present antihypertensive regimen Lasix 20 mg daily, losartan 50 mg daily, metoprolol titrate 25 mg twice daily.  If BP poorly controlled in the future would consider adjustment from losartan to olmesartan or transition to losartan twice daily for better 24-hour coverage.  Prior PE on chronic anticoagulation - Reduced dose OAC due to age, renal function.  Denies bleeding, complications.   Hypothyroidism - Continue to follow with PCP.        Dispo: follow up in 3 months  Signed, Alver Sorrow, NP

## 2022-12-04 NOTE — Patient Instructions (Addendum)
Medication Instructions:  Your physician has recommended you make the following change in your medication:   Lasix 20mg  daily Pick up lower dose Eliquis at pharmacy [facility] O[facility]  *If you need a refill on your cardiac medications before your next appointment, please call your pharmacy*   Follow-Up: At [facility], you and your health needs are our priority.  As part of our continuing mission to provide you with exceptional heart care, we have created designated Provider Care Teams.  These Care Teams include your primary Cardiologist (physician) and Advanced Practice Providers (APPs -  Physician Assistants and Nurse Practitioners) who all work together to provide you with the care you need, when you need it.  We recommend signing up for the patient portal called "MyChart".  Sign up information is provided on this After Visit Summary.  MyChart is used to connect with patients for Virtual Visits (Telemedicine).  Patients are able to view lab/test results, encounter notes, upcoming appointments, etc.  Non-urgent messages can be sent to your provider as well.   To learn more about what you can do with MyChart, go to [email or web address].    Your next appointment:   Follow up with [name], NP within 2-3 months. ([facility] CLINIC)  Other Instructions Pleas consider purchasing [employer] blood pressure cuff.

## 2022-12-17 ENCOUNTER — Encounter: Payer: Self-pay | Admitting: Podiatry

## 2022-12-17 ENCOUNTER — Ambulatory Visit (INDEPENDENT_AMBULATORY_CARE_PROVIDER_SITE_OTHER): Payer: Medicare Other | Admitting: Podiatry

## 2022-12-17 DIAGNOSIS — D689 Coagulation defect, unspecified: Secondary | ICD-10-CM | POA: Diagnosis not present

## 2022-12-17 DIAGNOSIS — M79675 Pain in left toe(s): Secondary | ICD-10-CM | POA: Diagnosis not present

## 2022-12-17 DIAGNOSIS — L84 Corns and callosities: Secondary | ICD-10-CM

## 2022-12-17 DIAGNOSIS — M79674 Pain in right toe(s): Secondary | ICD-10-CM | POA: Diagnosis not present

## 2022-12-17 DIAGNOSIS — B351 Tinea unguium: Secondary | ICD-10-CM | POA: Diagnosis not present

## 2022-12-17 NOTE — Progress Notes (Signed)
  Subjective:  Patient ID: Morgan Harrell, female    DOB: [birth year],   MRN: [id number]  No chief complaint on file.   89 y.o. female presents for concern of thickened elongated and painful nails that are difficult to trim. Requesting to have them trimmed today. Also relates corns. She is on blood thinner and at risk for foot care.   PCP:  Annita Brod, MD    . Denies any other pedal complaints. Denies n/v/f/c.   Past Medical History:  Diagnosis Date   GERD (gastroesophageal reflux disease)    HLD (hyperlipidemia)    HTN (hypertension)    Hypothyroidism     Objective:  Physical Exam: Vascular: DP/PT pulses 2/4 bilateral. CFT <3 seconds. Absent hair growth on digits. Edema noted to bilateral lower extremities. Xerosis noted bilaterally.  Skin. No lacerations or abrasions bilateral feet. Nails 1-5 bilateral  are thickened discolored and elongated with subungual debris. Hyperkeratotic tissue noted to bilateral fifth digit  third fourth and   fifth toes on the right, sub met head 1 on right and third on right and sub met head 5 on left.  Musculoskeletal: MMT 5/5 bilateral lower extremities in DF, PF, Inversion and Eversion. Deceased ROM in DF of ankle joint.  Neurological: Sensation intact to light touch. Protective intact diminished bilateral.    Assessment:   1. Pain due to onychomycosis of toenails of both feet   2. Corns and callosities   3. Coagulation defect [facility])      Plan:  Patient was evaluated and treated and all questions answered..  -Mechanically debrided all nails 1-5 bilateral using sterile nail nipper and filed with dremel without incident  -Answered all patient questions -Hyeprekratotic tissue x 8 debrided without incident with chisel.  -Patient to return  in 3 months for at risk foot care -Patient advised to call the office if any problems or questions arise in the meantime.   Louann Sjogren, DPM

## 2023-01-07 ENCOUNTER — Telehealth: Payer: Self-pay | Admitting: Cardiovascular Disease

## 2023-01-07 NOTE — Telephone Encounter (Signed)
Called and spoke to patient.  Daughter's concerns of patient: - sleeps little - up at night - more tired and increased fatigue - wants to know last lab results  Nurse's recommendations: - reach out to primary care for lab results and possible thyroid issues d/t hypothyroidism and side effects of increased fatigue.  - primary care requested last labs  Will route to APP for fyi. Daughter verbalized understanding.

## 2023-01-07 NOTE — Telephone Encounter (Signed)
Pt's daughter states that pt has been extremely tired more than normal for that last month. They do not recall getting lab results from last lab work. They would like a c/b regarding this matter. Please advise

## 2023-01-07 NOTE — Telephone Encounter (Signed)
Agree with plan for PCP eval.   Alver Sorrow, NP

## 2023-02-26 ENCOUNTER — Encounter (HOSPITAL_BASED_OUTPATIENT_CLINIC_OR_DEPARTMENT_OTHER): Payer: Medicare Other | Admitting: Family

## 2023-03-10 DIAGNOSIS — N184 Chronic kidney disease, stage 4 (severe): Secondary | ICD-10-CM | POA: Insufficient documentation

## 2023-03-18 ENCOUNTER — Ambulatory Visit: Payer: Medicare Other | Admitting: Podiatry

## 2023-03-27 ENCOUNTER — Emergency Department (HOSPITAL_COMMUNITY): Payer: Medicare Other

## 2023-03-27 ENCOUNTER — Other Ambulatory Visit: Payer: Self-pay

## 2023-03-27 ENCOUNTER — Encounter (HOSPITAL_COMMUNITY): Payer: Self-pay

## 2023-03-27 ENCOUNTER — Emergency Department (HOSPITAL_COMMUNITY)
Admission: EM | Admit: 2023-03-27 | Discharge: 2023-03-28 | Disposition: A | Discharge status: Home / Self Care | Payer: Medicare Other | Attending: Emergency Medicine | Admitting: Emergency Medicine

## 2023-03-27 DIAGNOSIS — Z7901 Long term (current) use of anticoagulants: Secondary | ICD-10-CM | POA: Insufficient documentation

## 2023-03-27 DIAGNOSIS — M7989 Other specified soft tissue disorders: Secondary | ICD-10-CM | POA: Diagnosis present

## 2023-03-27 DIAGNOSIS — I5189 Other ill-defined heart diseases: Secondary | ICD-10-CM | POA: Insufficient documentation

## 2023-03-27 DIAGNOSIS — R600 Localized edema: Secondary | ICD-10-CM | POA: Diagnosis not present

## 2023-03-27 DIAGNOSIS — M109 Gout, unspecified: Secondary | ICD-10-CM | POA: Diagnosis not present

## 2023-03-27 DIAGNOSIS — E039 Hypothyroidism, unspecified: Secondary | ICD-10-CM | POA: Insufficient documentation

## 2023-03-27 DIAGNOSIS — M79662 Pain in left lower leg: Secondary | ICD-10-CM

## 2023-03-27 DIAGNOSIS — Z79899 Other long term (current) drug therapy: Secondary | ICD-10-CM | POA: Insufficient documentation

## 2023-03-27 DIAGNOSIS — I129 Hypertensive chronic kidney disease with stage 1 through stage 4 chronic kidney disease, or unspecified chronic kidney disease: Secondary | ICD-10-CM | POA: Insufficient documentation

## 2023-03-27 DIAGNOSIS — Z85038 Personal history of other malignant neoplasm of large intestine: Secondary | ICD-10-CM | POA: Diagnosis not present

## 2023-03-27 DIAGNOSIS — M1A00X0 Idiopathic chronic gout, unspecified site, without tophus (tophi): Secondary | ICD-10-CM | POA: Insufficient documentation

## 2023-03-27 DIAGNOSIS — N189 Chronic kidney disease, unspecified: Secondary | ICD-10-CM | POA: Insufficient documentation

## 2023-03-27 LAB — CBC WITH DIFFERENTIAL/PLATELET
Abs Immature Granulocytes: 0.04 10*3/uL (ref 0.00–0.07)
Basophils Absolute: 0 10*3/uL (ref 0.0–0.1)
Basophils Relative: 0 %
Eosinophils Absolute: 0 10*3/uL (ref 0.0–0.5)
Eosinophils Relative: 1 %
HCT: 36.9 % (ref 36.0–46.0)
Hemoglobin: 11.3 g/dL — ABNORMAL LOW (ref 12.0–15.0)
Immature Granulocytes: 1 %
Lymphocytes Relative: 9 %
Lymphs Abs: 0.8 10*3/uL (ref 0.7–4.0)
MCH: 28.7 pg (ref 26.0–34.0)
MCHC: 30.6 g/dL (ref 30.0–36.0)
MCV: 93.7 fL (ref 80.0–100.0)
Monocytes Absolute: 0.8 10*3/uL (ref 0.1–1.0)
Monocytes Relative: 10 %
Neutro Abs: 6.7 10*3/uL (ref 1.7–7.7)
Neutrophils Relative %: 79 %
Platelets: 215 10*3/uL (ref 150–400)
RBC: 3.94 MIL/uL (ref 3.87–5.11)
RDW: 14.6 % (ref 11.5–15.5)
WBC: 8.4 10*3/uL (ref 4.0–10.5)
nRBC: 0 % (ref 0.0–0.2)

## 2023-03-27 LAB — URINALYSIS, ROUTINE W REFLEX MICROSCOPIC
Bacteria, UA: NONE SEEN
Bilirubin Urine: NEGATIVE
Glucose, UA: >=500 mg/dL — AB
Hgb urine dipstick: NEGATIVE
Ketones, ur: NEGATIVE mg/dL
Leukocytes,Ua: NEGATIVE
Nitrite: NEGATIVE
Protein, ur: NEGATIVE mg/dL
Specific Gravity, Urine: 1.014 (ref 1.005–1.030)
pH: 5 (ref 5.0–8.0)

## 2023-03-27 LAB — SYNOVIAL CELL COUNT + DIFF, W/ CRYSTALS
Eosinophils-Synovial: 0 % (ref 0–1)
Lymphocytes-Synovial Fld: 0 % (ref 0–20)
Monocyte-Macrophage-Synovial Fluid: 0 % — ABNORMAL LOW (ref 50–90)
Neutrophil, Synovial: 100 % — ABNORMAL HIGH (ref 0–25)
WBC, Synovial: UNDETERMINED /mm3 (ref 0–200)

## 2023-03-27 LAB — COMPREHENSIVE METABOLIC PANEL
ALT: 12 U/L (ref 0–44)
AST: 15 U/L (ref 15–41)
Albumin: 3.4 g/dL — ABNORMAL LOW (ref 3.5–5.0)
Alkaline Phosphatase: 58 U/L (ref 38–126)
Anion gap: 10 (ref 5–15)
BUN: 49 mg/dL — ABNORMAL HIGH (ref 8–23)
CO2: 22 mmol/L (ref 22–32)
Calcium: 9.2 mg/dL (ref 8.9–10.3)
Chloride: 108 mmol/L (ref 98–111)
Creatinine, Ser: 1.71 mg/dL — ABNORMAL HIGH (ref 0.44–1.00)
GFR, Estimated: 27 mL/min — ABNORMAL LOW (ref >60)
Glucose, Bld: 103 mg/dL — ABNORMAL HIGH (ref 70–99)
Potassium: 4.6 mmol/L (ref 3.5–5.1)
Sodium: 140 mmol/L (ref 135–145)
Total Bilirubin: 0.8 mg/dL (ref 0.0–1.2)
Total Protein: 7.3 g/dL (ref 6.5–8.1)

## 2023-03-27 LAB — BRAIN NATRIURETIC PEPTIDE: B Natriuretic Peptide: 734.4 pg/mL — ABNORMAL HIGH (ref 0.0–100.0)

## 2023-03-27 MED ORDER — OXYCODONE HCL 5 MG PO TABS
2.5000 mg | ORAL_TABLET | Freq: Once | ORAL | Status: AC
  Administered 2023-03-27: 2.5 mg via ORAL
  Filled 2023-03-27: qty 1

## 2023-03-27 MED ORDER — LIDOCAINE-EPINEPHRINE (PF) 2 %-1:200000 IJ SOLN
10.0000 mL | Freq: Once | INTRAMUSCULAR | Status: DC
  Filled 2023-03-27: qty 20

## 2023-03-27 MED ORDER — OXYCODONE HCL 5 MG PO TABS: 5.0000 mg | ORAL_TABLET | ORAL | 0 refills | Status: DC | PRN

## 2023-03-27 MED ORDER — COLCHICINE 0.6 MG PO TABS: 0.6000 mg | ORAL_TABLET | Freq: Every day | ORAL | 0 refills | Status: DC

## 2023-03-27 NOTE — ED Provider Triage Note (Signed)
 Emergency Medicine Provider Triage Evaluation Note  Morgan Harrell , a 89 y.o. female  was evaluated in triage.  Pt complains of bilateral lower extremity swelling notably worse in the left.  Has pain in the left, unable to bear weight.  No injury or trauma.  History of blood clot on Eliquis.  Review of Systems  Positive:  Negative:   Physical Exam  BP (!) 163/77   Pulse (!) 58   Temp 98.9 F (37.2 C) (Oral)   Resp 17   Ht 5\' 2"  (1.575 m)   Wt 83.9 kg   SpO2 93%   BMI 33.83 kg/m  Gen:   Awake, no distress   Resp:  Normal effort  MSK:   Bilateral 2+ pitting edema, notably tender to left lower extremity with anterior erythema Other:    Medical Decision Making  Medically screening exam initiated at 2:13 PM.  Appropriate orders placed.  Morgan Harrell was informed that the remainder of the evaluation will be completed by another provider, this initial triage assessment does not replace that evaluation, and the importance of remaining in the ED until their evaluation is complete.     Halford Decamp, PA-C 03/27/23 1416

## 2023-03-27 NOTE — ED Provider Notes (Signed)
 [place] EMERGENCY DEPARTMENT AT [facility] Provider Note   CSN: [id number] Arrival date & time: 03/27/23  1316     History  Chief Complaint  Patient presents with   Leg Swelling    Morgan Harrell is a 89 y.o. female with HTN, HLD, hypothyroidism, CKD, h/o DVT/PE, OSA, GERD, h/o colon cancer, gout who presents with bilateral lower extremity swelling notably worse in the left.  Has pain in the left, mostly in the ankle, unable to bear weight at all. New since yesterday.  No injury or trauma.  History of DVT/PE on Eliquis. Recently decreased dose of eliquis, but hasn't missed any doses recently. No f/c, numbness/tingling. No h/o similar pain.   Past Medical History:  Diagnosis Date   GERD (gastroesophageal reflux disease)    HLD (hyperlipidemia)    HTN (hypertension)    Hypothyroidism        Home Medications Prior to Admission medications   Medication Sig Start Date End Date Taking? Authorizing Provider  albuterol (PROVENTIL HFA;VENTOLIN HFA) 108 (90 Base) MCG/ACT inhaler Inhale 2 puffs into the lungs every 4 (four) hours as needed for wheezing or shortness of breath.    [provider]  apixaban (ELIQUIS) 2.5 MG TABS tablet Take 1 tablet (2.5 mg total) by mouth 2 (two) times daily. 10/15/22   Chilton Si, MD  Cyanocobalamin (VITAMIN B-12 PO) Take by mouth.    [provider]  diclofenac Sodium (VOLTAREN) 1 % GEL diclofenac 1 % topical gel    [provider]  FARXIGA 5 MG TABS tablet Take 1 tablet (5 mg total) by mouth daily before breakfast. 10/15/22   Chilton Si, MD  fluticasone [facility]) 50 MCG/ACT nasal spray Place 2 sprays into both nostrils as needed.    [provider]  furosemide (LASIX) 20 MG tablet Take 1 tablet (20 mg total) by mouth daily. 12/04/22   Alver Sorrow, NP  gabapentin (NEURONTIN) 100 MG capsule 1 capsule Orally three a day    [provider]  ketorolac (ACULAR) 0.5 % ophthalmic  solution as needed.    [provider]  levothyroxine (SYNTHROID) 50 MCG tablet Take 1 tablet by mouth daily.    [provider]  loratadine (CLARITIN) 10 MG tablet Take 10 mg by mouth daily.    [provider]  losartan (COZAAR) 50 MG tablet Take 1 tablet (50 mg total) by mouth daily. 10/15/22   Chilton Si, MD  meclizine (ANTIVERT) 25 MG tablet Take 25 mg by mouth daily as needed for dizziness (vertigo).    [provider]  metoprolol tartrate (LOPRESSOR) 25 MG tablet Take 25 mg by mouth 2 (two) times daily. 11/03/19   [provider]  Multiple Vitamins-Minerals (MEGA MULTIVITAMIN FOR WOMEN) TABS Take by mouth.    [provider]  triamcinolone (KENALOG) 0.025 % ointment triamcinolone acetonide 0.025 % topical ointment  APPLY THIN LAYER TOPICALLY TO THE AFFECTED AREA TWICE DAILY    [provider]      Allergies    Codeine    Review of Systems   Review of Systems A 10 point review of systems was performed and is negative unless otherwise reported in HPI.  Physical Exam Updated Vital Signs BP (!) 158/80   Pulse 79   Temp 98.9 F (37.2 C) (Oral)   Resp 18   Ht 5\' 2"  (1.575 m)   Wt 83.9 kg   SpO2 93%   BMI 33.83 kg/m  Physical Exam General: Normal appearing  elderly female, lying in bed.  HEENT: Sclera anicteric, MMM, trachea midline.  Cardiology: RRR, no murmurs/rubs/gallops.  Resp: Normal respiratory rate and effort. CTAB, no wheezes, rhonchi, crackles.  Abd: Soft, non-tender, non-distended. No rebound tenderness or guarding.  GU: Deferred. MSK: TTP diffusely to left ankle with mild effusion. No significant erythema to left ankle joint. No TTP to forefoot, 5th metatarsal. FROM of foot/toes. Limited ROM of ankle d/t pain and pain with passive ROM as well. Intact DP/PT pulses. No TTP to calf and no calf swelling. Soft compartments. No e/o trauma, no deformity. Skin: warm, dry.  Neuro: A&Ox4, CNs II-XII grossly  intact. MAEs. Sensation grossly intact.  Psych: Normal mood and affect.   ED Results / Procedures / Treatments   Labs (all labs ordered are listed, but only abnormal results are displayed) Labs Reviewed  CBC WITH DIFFERENTIAL/PLATELET - Abnormal; Notable for the following components:      Result Value   Hemoglobin 11.3 (*)    All other components within normal limits  COMPREHENSIVE METABOLIC PANEL - Abnormal; Notable for the following components:   Glucose, Bld 103 (*)    BUN 49 (*)    Creatinine, Ser 1.71 (*)    Albumin 3.4 (*)    GFR, Estimated 27 (*)    All other components within normal limits  BRAIN NATRIURETIC PEPTIDE - Abnormal; Notable for the following components:   B Natriuretic Peptide 734.4 (*)    All other components within normal limits  URINALYSIS, ROUTINE W REFLEX MICROSCOPIC - Abnormal; Notable for the following components:   Glucose, UA >=500 (*)    All other components within normal limits  BODY FLUID CULTURE W GRAM STAIN  GLUCOSE, BODY FLUID OTHER            PROTEIN, BODY FLUID (OTHER)  SYNOVIAL CELL COUNT + DIFF, W/ CRYSTALS  URIC ACID, BODY FLUID    EKG None  Radiology VAS [place] LOWER EXTREMITY VENOUS (DVT) (7a-7p) Result Date: 03/27/2023  Lower Venous DVT Study Patient Name:  Morgan Harrell  Date of Exam:   03/27/2023 Medical Rec #: [id number]       Accession #:    [id number] Date of Birth: [birth year]       Patient Gender: F Patient Age:   56 years Exam Location:  [facility] Procedure:      VAS [place] LOWER EXTREMITY VENOUS (DVT) Referring Phys: Fayrene Fearing [place] --------------------------------------------------------------------------------  Indications: Pain, and Swelling.  Comparison Study: Previous study on 8.13.2020. Performing Technologist: Fernande Bras  Examination Guidelines: A complete evaluation includes B-mode imaging, spectral Doppler, color Doppler, and power Doppler as needed of all accessible portions of each vessel. Bilateral testing is  considered an integral part of a complete examination. Limited examinations for reoccurring indications may be performed as noted. The reflux portion of the exam is performed with the patient in reverse Trendelenburg.  +-----+---------------+---------+-----------+----------+--------------+ RIGHTCompressibilityPhasicitySpontaneityPropertiesThrombus Aging +-----+---------------+---------+-----------+----------+--------------+ CFV  Full           Yes      Yes                                 +-----+---------------+---------+-----------+----------+--------------+ SFJ  Full           Yes      Yes                                 +-----+---------------+---------+-----------+----------+--------------+   +---------+---------------+---------+-----------+----------+-------------------+  LEFT     CompressibilityPhasicitySpontaneityPropertiesThrombus Aging      +---------+---------------+---------+-----------+----------+-------------------+ CFV      Full           Yes      Yes                                      +---------+---------------+---------+-----------+----------+-------------------+ SFJ      Full           Yes      Yes                                      +---------+---------------+---------+-----------+----------+-------------------+ FV Prox  Full                                                             +---------+---------------+---------+-----------+----------+-------------------+ FV Mid   Full                                                             +---------+---------------+---------+-----------+----------+-------------------+ FV DistalFull                                                             +---------+---------------+---------+-----------+----------+-------------------+ PFV      Full                                                             +---------+---------------+---------+-----------+----------+-------------------+ POP       Full           Yes      Yes                                      +---------+---------------+---------+-----------+----------+-------------------+ PTV                                                   Not well                                                                  visualized, patent  by color.           +---------+---------------+---------+-----------+----------+-------------------+ PERO                                                  Not well                                                                  visualized, patent                                                        by color.           +---------+---------------+---------+-----------+----------+-------------------+    Summary: RIGHT: - No evidence of common femoral vein obstruction.   LEFT: - There is no evidence of deep vein thrombosis in the lower extremity.  - No cystic structure found in the popliteal fossa.  *See table(s) above for measurements and observations.    Preliminary    XR left ankle: Diffuse soft tissue swelling. No acute bony abnormalities. Probable old fracture deformity of the posterior calcaneus.   Procedures .Joint Aspiration/Arthrocentesis  Date/Time: 04/06/2023 2:52 AM  Performed by: Loetta Rough, MD Authorized by: Loetta Rough, MD   Consent:    Consent obtained:  Verbal   Consent given by:  Patient   Risks discussed:  Nerve damage, pain, infection, bleeding and incomplete drainage   Alternatives discussed:  No treatment and delayed treatment Universal protocol:    Procedure explained and questions answered to patient or proxy's satisfaction: yes     Test results available: yes     Imaging studies available: yes     Immediately prior to procedure, a time out was called: yes     Patient identity confirmed:  Verbally with patient Location:    Location:  Ankle   Ankle:  L ankle Anesthesia:     Anesthesia method:  Local infiltration   Local anesthetic:  Lidocaine 2% WITH epi Procedure details:    Needle gauge:  18 G   Ultrasound guidance: yes     Approach:  Medial   Aspirate amount:  4 cc   Aspirate characteristics:  Serous and blood-tinged   Steroid injected: no     Specimen collected: yes   Post-procedure details:    Dressing:  Adhesive bandage   Procedure completion:  Tolerated well, no immediate complications     Medications Ordered in ED Medications  lidocaine-EPINEPHrine (XYLOCAINE W/EPI) 2 %-1:200000 (PF) injection 10 mL (has no administration in time range)  oxyCODONE (Oxy IR/ROXICODONE) immediate release tablet 2.5 mg (has no administration in time range)    ED Course/ Medical Decision Making/ A&P                          Medical Decision Making Amount and/or Complexity of Data Reviewed Labs: ordered. Decision-making details documented in ED Course. Radiology: ordered. Decision-making details documented in ED Course.  Risk Prescription drug management.  This patient presents to the ED for concern of atraumatic left ankle pain, this involves an extensive number of treatment options, and is a complaint that carries with it a high risk of complications and morbidity.  I considered the following differential and admission for this acute, potentially life threatening condition.   MDM:    Consider DVT/PE, ankle fracture though patient reports no trauma, cellulitis, or septic arthritis. Patient's pain/TTP seems to be diffusely throughout ankle joint. She does have some erythema further superior to the ankle joint but none immediately over the ankle. Warm foot with good pulses, NVI, no c/f arterial ischemia. Has pain with passive ROM. Her [place] is neg for DVT.  Reassuringly she is afebrile, but monoarticular nontraumatic arthritis w/ pain w/ PROM will need to be evaluated w/ arthrocentesis to r/o septic arthritis or possible gout. Discussed with patient who is in  agreement.   Clinical Course as of 04/06/23 0249  Fri Mar 27, 2023  1518 Hemoglobin(!): 11.3 Decreased from 12.5 one year ago [HN]  1518 WBC: 8.4 No leukocytosis  [HN]  1519 Creatinine(!): 1.71 [HN]  1519 BUN(!): 49 Elevated similar to basleine [HN]  1557 B Natriuretic Peptide(!): 734.4 Elevated [HN]  1624 Urinalysis, Routine w reflex microscopic -Urine, Clean Catch(!) Unremarkable in the context of this patient's presentation  [HN]  1656 VAS [place] LOWER EXTREMITY VENOUS (DVT) (7a-7p) RIGHT: - No evidence of common femoral vein obstruction.        LEFT:     - There is no evidence of deep vein thrombosis in the lower extremity.   - No cystic structure found in the popliteal fossa.    *See table(s) above for measurements and observations.   [HN]  1735 DG Ankle Complete Left Diffuse soft tissue swelling. No acute bony abnormalities. Probable old fracture deformity of the posterior calcaneus.   [HN]  1925 Arthrocentesis completed [HN]    Clinical Course User Index [HN] Loetta Rough, MD    Labs: I Ordered, and personally interpreted labs.  The pertinent results include:  those listed above  Imaging Studies ordered: DVT US ordered from triage. I ordered imaging studies including L ankle R. I independently visualized and interpreted imaging. I agree with the radiologist interpretation  Additional history obtained from chart review, family at bedside.    Reevaluation: After the interventions noted above, I reevaluated the patient and found that they have :improved  Social Determinants of Health: Lives independently  Disposition:  Patient is signed out to the ED PA  who is made aware of her history, presentation, exam, workup, and plan. Plan is to await results of arthrocentesis to r/o septic arthritis.   Co morbidities that complicate the patient evaluation  Past Medical History:  Diagnosis Date   GERD (gastroesophageal reflux disease)    HLD  (hyperlipidemia)    HTN (hypertension)    Hypothyroidism      Medicines Meds ordered this encounter  Medications   lidocaine-EPINEPHrine (XYLOCAINE W/EPI) 2 %-1:200000 (PF) injection 10 mL   oxyCODONE (Oxy IR/ROXICODONE) immediate release tablet 2.5 mg    Refill:  0    I have reviewed the patients home medicines and have made adjustments as needed  Problem List / ED Course: Problem List Items Addressed This Visit   None Visit Diagnoses       Acute gout of left ankle, unspecified cause    -  Primary   Relevant Medications   oxyCODONE (Oxy IR/ROXICODONE) immediate release tablet 2.5 mg (Completed)   oxyCODONE (  ROXICODONE) 5 MG immediate release tablet   colchicine 0.6 MG tablet                   This note was created using dictation software, which may contain spelling or grammatical errors.    Loetta Rough, MD 04/06/23 [phone]

## 2023-03-27 NOTE — ED Provider Notes (Signed)
 F/u on arthrocentesis result.  If negative, then treat abx for cellulitis.    Received signout from previous provider, please see her note for complete H&P.  This is a 89 year old female presenting with complaints of atraumatic left ankle pain.  Her pain has been ongoing for the past 2 days.  No associate fever or chills no injury.  She does have history of arthritis for which she has had joint injection in the past in her knees and hands.  Exam notable for moderate pitting edema to left lower extremity with surrounding erythema and warmth.  Left ankle with decreased range of motion and tenderness to palpation.  -Labs ordered, independently viewed and interpreted by me.  Labs remarkable for synovial cell count of L ankle showing red color, turbid appearance with extracellular monosodium urate crystals, but unfortunately unable to perform WBC due to sample being clotted.  100 neutrophil.  Normal WBC.   -The patient was maintained on a cardiac monitor.  I personally viewed and interpreted the cardiac monitored which showed an underlying rhythm of: sinus brady -Imaging independently viewed and interpreted by me and I agree with radiologist's interpretation.  Result remarkable for x-ray of the left ankle shows diffuse soft tissue swelling without bony abnormality.  Vascular ultrasound of bilateral extremities without evidence of DVT. -This patient presents to the ED for concern of ankle pain, this involves an extensive number of treatment options, and is a complaint that carries with it a high risk of complications and morbidity.  The differential diagnosis includes gout, septic joint, fx, dislocation, dvt, cellulitis -Co morbidities that complicate the patient evaluation includes gout, GERD, HTN, HLD, CHF, CKD, PE -Treatment includes oxycodone -Reevaluation of the patient after these medicines showed that the patient improved -PCP office notes or outside notes reviewed -Discussion with specialist  orthopedist Dr. Wilford Grist. He recommend treating pt for gout.  If fluid cultures came back with infection then pt can be notified for further treatment.  Does not recommend abx at this time.   -Escalation to admission/observation considered: patients feels much better, is comfortable with discharge, and will follow up with PCP -Prescription medication considered, patient comfortable with oxycodone, colchicine -Social Determinant of Health considered    Family and patient felt more comfortable going home.  Family will care for patient as appropriate.  BP 132/67 (BP Location: Left Arm)   Pulse 63   Temp 98.7 F (37.1 C) (Oral)   Resp 19   Ht 5\' 2"  (1.575 m)   Wt 83.9 kg   SpO2 94%   BMI 33.83 kg/m   Results for orders placed or performed during the hospital encounter of 03/27/23  CBC with Differential   Collection Time: 03/27/23  2:36 PM  Result Value Ref Range   WBC 8.4 4.0 - 10.5 K/uL   RBC 3.94 3.87 - 5.11 MIL/uL   Hemoglobin 11.3 (L) 12.0 - 15.0 g/dL   HCT 40.9 81.1 - 91.4 %   MCV 93.7 80.0 - 100.0 fL   MCH 28.7 26.0 - 34.0 pg   MCHC 30.6 30.0 - 36.0 g/dL   RDW 78.2 95.6 - 21.3 %   Platelets 215 150 - 400 K/uL   nRBC 0.0 0.0 - 0.2 %   Neutrophils Relative % 79 %   Neutro Abs 6.7 1.7 - 7.7 K/uL   Lymphocytes Relative 9 %   Lymphs Abs 0.8 0.7 - 4.0 K/uL   Monocytes Relative 10 %   Monocytes Absolute 0.8 0.1 - 1.0 K/uL   Eosinophils  Relative 1 %   Eosinophils Absolute 0.0 0.0 - 0.5 K/uL   Basophils Relative 0 %   Basophils Absolute 0.0 0.0 - 0.1 K/uL   Immature Granulocytes 1 %   Abs Immature Granulocytes 0.04 0.00 - 0.07 K/uL  Comprehensive metabolic panel   Collection Time: 03/27/23  2:36 PM  Result Value Ref Range   Sodium 140 135 - 145 mmol/L   Potassium 4.6 3.5 - 5.1 mmol/L   Chloride 108 98 - 111 mmol/L   CO2 22 22 - 32 mmol/L   Glucose, Bld 103 (H) 70 - 99 mg/dL   BUN 49 (H) 8 - 23 mg/dL   Creatinine, Ser 1.61 (H) 0.44 - 1.00 mg/dL   Calcium 9.2 8.9 -  09.6 mg/dL   Total Protein 7.3 6.5 - 8.1 g/dL   Albumin 3.4 (L) 3.5 - 5.0 g/dL   AST 15 15 - 41 U/L   ALT 12 0 - 44 U/L   Alkaline Phosphatase 58 38 - 126 U/L   Total Bilirubin 0.8 0.0 - 1.2 mg/dL   GFR, Estimated 27 (L) >60 mL/min   Anion gap 10 5 - 15  Brain natriuretic peptide   Collection Time: 03/27/23  2:36 PM  Result Value Ref Range   B Natriuretic Peptide 734.4 (H) 0.0 - 100.0 pg/mL  Urinalysis, Routine w reflex microscopic -Urine, Clean Catch   Collection Time: 03/27/23  4:01 PM  Result Value Ref Range   Color, Urine YELLOW YELLOW   APPearance CLEAR CLEAR   Specific Gravity, Urine 1.014 1.005 - 1.030   pH 5.0 5.0 - 8.0   Glucose, UA >=500 (A) NEGATIVE mg/dL   Hgb urine dipstick NEGATIVE NEGATIVE   Bilirubin Urine NEGATIVE NEGATIVE   Ketones, ur NEGATIVE NEGATIVE mg/dL   Protein, ur NEGATIVE NEGATIVE mg/dL   Nitrite NEGATIVE NEGATIVE   Leukocytes,Ua NEGATIVE NEGATIVE   RBC / HPF 0-5 0 - 5 RBC/hpf   WBC, UA 0-5 0 - 5 WBC/hpf   Bacteria, UA NONE SEEN NONE SEEN   Squamous Epithelial / HPF 0-5 0 - 5 /HPF   Mucus PRESENT   Synovial cell count + diff, w/ crystals   Collection Time: 03/27/23  7:32 PM  Result Value Ref Range   Color, Synovial RED (A) YELLOW   Appearance-Synovial TURBID (A) CLEAR   Crystals, Fluid EXTRACELLULAR MONOSODIUM URATE CRYSTALS    WBC, Synovial CLOTTED UNABLE TO PERFORM COUNT 0 - 200 /cu mm   Neutrophil, Synovial 100 (H) 0 - 25 %   Lymphocytes-Synovial Fld 0 0 - 20 %   Monocyte-Macrophage-Synovial Fluid 0 (L) 50 - 90 %   Eosinophils-Synovial 0 0 - 1 %   VAS [place] LOWER EXTREMITY VENOUS (DVT) (7a-7p) Result Date: 03/27/2023  Lower Venous DVT Study Patient Name:  Morgan MARMO  Date of Exam:   03/27/2023 Medical Rec #: [id number]       Accession #:    [id number] Date of Birth: 07-18-1924       Patient Gender: F Patient Age:   50 years Exam [facility] Procedure:      VAS [place] LOWER EXTREMITY VENOUS (DVT) Referring Phys: Fayrene Fearing [place]  --------------------------------------------------------------------------------  Indications: Pain, and Swelling.  Comparison Study: Previous study on 8.13.2020. Performing Technologist: Fernande Bras  Examination Guidelines: A complete evaluation includes B-mode imaging, spectral Doppler, color Doppler, and power Doppler as needed of all accessible portions of each vessel. Bilateral testing is considered an integral part of a complete examination. Limited examinations  for reoccurring indications may be performed as noted. The reflux portion of the exam is performed with the patient in reverse Trendelenburg.  +-----+---------------+---------+-----------+----------+--------------+ RIGHTCompressibilityPhasicitySpontaneityPropertiesThrombus Aging +-----+---------------+---------+-----------+----------+--------------+ CFV  Full           Yes      Yes                                 +-----+---------------+---------+-----------+----------+--------------+ SFJ  Full           Yes      Yes                                 +-----+---------------+---------+-----------+----------+--------------+   +---------+---------------+---------+-----------+----------+-------------------+ LEFT     CompressibilityPhasicitySpontaneityPropertiesThrombus Aging      +---------+---------------+---------+-----------+----------+-------------------+ CFV      Full           Yes      Yes                                      +---------+---------------+---------+-----------+----------+-------------------+ SFJ      Full           Yes      Yes                                      +---------+---------------+---------+-----------+----------+-------------------+ FV Prox  Full                                                             +---------+---------------+---------+-----------+----------+-------------------+ FV Mid   Full                                                              +---------+---------------+---------+-----------+----------+-------------------+ FV DistalFull                                                             +---------+---------------+---------+-----------+----------+-------------------+ PFV      Full                                                             +---------+---------------+---------+-----------+----------+-------------------+ POP      Full           Yes      Yes                                      +---------+---------------+---------+-----------+----------+-------------------+ PTV  Not well                                                                  visualized, patent                                                        by color.           +---------+---------------+---------+-----------+----------+-------------------+ PERO                                                  Not well                                                                  visualized, patent                                                        by color.           +---------+---------------+---------+-----------+----------+-------------------+     Summary: RIGHT: - No evidence of common femoral vein obstruction.   LEFT: - There is no evidence of deep vein thrombosis in the lower extremity.  - No cystic structure found in the popliteal fossa.  *See table(s) above for measurements and observations. Electronically signed by Coral Else MD on 03/27/2023 at 8:44:47 PM.    Final    DG Ankle Complete Left Result Date: 03/27/2023 CLINICAL DATA:  Ankle pain and swelling. Symptoms started yesterday. Pain worse on the left. EXAM: LEFT ANKLE COMPLETE - 3+ VIEW COMPARISON:  None Available. FINDINGS: Deformity of the posterior calcaneus may indicate old fracture deformity. Small calcaneal spurs are present. No evidence of acute fracture or dislocation. No focal bone lesion  or bone destruction. Joint spaces are normal. Mild diffuse soft tissue swelling. No radiopaque soft tissue foreign bodies or soft tissue gas. IMPRESSION: Diffuse soft tissue swelling. No acute bony abnormalities. Probable old fracture deformity of the posterior calcaneus. Electronically Signed   By: Burman Nieves M.D.   On: 03/27/2023 17:17      Fayrene Helper, PA-C 03/27/23 2313    Loetta Rough, MD 04/03/23 2125

## 2023-03-27 NOTE — Discharge Instructions (Signed)
 Your ankle pain is likely due to gout.  Please take medication prescribed.  Please follow-up closely with your podiatrist as recently as scheduled.  You will be notified if there is any signs of infection that shows up on your synovial fluid culture that you have been obtained today.  Please return to the ER if your symptoms worsen or if you have other concern.

## 2023-03-27 NOTE — ED Triage Notes (Signed)
 Patient brought in by EMS due to swelling in bilateral legs/ankles. Reports this started yesterday and this morning was unable to stand on legs. States the pain is worse in the left foot than the right.

## 2023-03-30 LAB — BODY FLUID CULTURE W GRAM STAIN

## 2023-03-30 LAB — GLUCOSE, BODY FLUID OTHER: Glucose, Body Fluid Other: 13 mg/dL

## 2023-03-30 LAB — URIC ACID, BODY FLUID: Uric Acid Body Fluid: 5.7 mg/dL

## 2023-03-31 ENCOUNTER — Telehealth (HOSPITAL_BASED_OUTPATIENT_CLINIC_OR_DEPARTMENT_OTHER): Payer: Self-pay

## 2023-03-31 NOTE — Telephone Encounter (Signed)
 Post ED Visit - Positive Culture Follow-up  Culture report reviewed by antimicrobial stewardship pharmacist: [facility] []  Enzo Bi, Pharm.D. []  Celedonio Miyamoto, Pharm.D., BCPS AQ-ID []  Garvin Fila, Pharm.D., BCPS []  Georgina Pillion, Pharm.D., BCPS []  [place], [place].D., BCPS, AAHIVP []  Estella Husk, Pharm.D., BCPS, AAHIVP []  Lysle Pearl, PharmD, BCPS []  Phillips Climes, PharmD, BCPS []  Agapito Games, PharmD, BCPS []  Verlan Friends, PharmD []  Mervyn Gay, PharmD, BCPS []  Vinnie Level, PharmD  [facility] [x]  Georgina Pillion, PharmD []  Greer Pickerel, PharmD []  Adalberto Cole, PharmD []  Perlie Gold, Rph []  Lonell Face) Jean Rosenthal, PharmD []  Earl Many, PharmD []  Junita Push, PharmD []  Dorna Leitz, PharmD []  Terrilee Files, PharmD []  Lynann Beaver, PharmD []  Keturah Barre, PharmD []  Loralee Pacas, PharmD []  Bernadene Person, PharmD   Positive left ankle synovial fluid culture Discharged with colchicine and OxyIR, no further treatment and no further patient follow-up is required at this time.  Morgan Harrell 03/31/2023, 10:55 AM

## 2023-03-31 NOTE — Progress Notes (Signed)
 ED Antimicrobial Stewardship Positive Culture Follow Up   Morgan Harrell is an 89 y.o. female who presented to [facility] on 03/27/2023 with a chief complaint of  Chief Complaint  Patient presents with   Leg Swelling    Recent Results (from the past 720 hours)  Body fluid culture w Gram Stain     Status: None   Collection Time: 03/27/23  7:32 PM   Specimen: Ankle; Body Fluid  Result Value Ref Range Status   Specimen Description   Final    ANKLE Performed at [facility], [address] [place], [place] [address]    Special Requests   Final    NONE Performed at [facility], [address], [place], [place] [address]    Gram Stain   Final    FEW WBC PRESENT,BOTH PMN AND MONONUCLEAR NO ORGANISMS SEEN    Culture   Final    RARE MICROCOCCUS LUTEUS/LYLAE Standardized susceptibility testing for this organism is not available. Performed at [facility], [address] [place], [place] [address]    Report Status 03/30/2023 FINAL  Final    [x]  No additional treatment indicated  [name] with B/L leg/ankle swelling L>R concerning for septic joint. L-ankle synovial fluid showed extracellular urate crystals - discussed with ortho and the patient was started on gout treatment.  The culture ended up growing micrococcus luteus however this is present on skin/mucous membranes and could have inadvertently been picked up in culture. Given the patient's presentation and presence of urate crystals - plan is to continue with the prescribed gout treatment at this time.   No additional treatment indicated   ED Provider: Estanislado Pandy, DO   Thank you for allowing pharmacy to be a part of this patient's care.  Georgina Pillion, PharmD, BCPS, BCIDP Infectious Diseases Clinical Pharmacist 03/31/2023 8:42 AM   **Pharmacist phone directory can now be found on [email or web address] ([email or web address]).  Listed under [facility].

## 2023-04-01 ENCOUNTER — Ambulatory Visit: Payer: Medicare Other | Admitting: Podiatry

## 2023-04-01 ENCOUNTER — Encounter: Payer: Self-pay | Admitting: Podiatry

## 2023-04-01 VITALS — Ht 62.0 in | Wt 185.0 lb

## 2023-04-01 DIAGNOSIS — M79675 Pain in left toe(s): Secondary | ICD-10-CM

## 2023-04-01 DIAGNOSIS — B351 Tinea unguium: Secondary | ICD-10-CM

## 2023-04-01 DIAGNOSIS — D689 Coagulation defect, unspecified: Secondary | ICD-10-CM | POA: Diagnosis not present

## 2023-04-01 DIAGNOSIS — L84 Corns and callosities: Secondary | ICD-10-CM

## 2023-04-01 DIAGNOSIS — M79674 Pain in right toe(s): Secondary | ICD-10-CM

## 2023-04-01 NOTE — Patient Instructions (Signed)
 Gout  Gout is painful swelling of your joints. Gout is a type of arthritis. It is caused by having too much uric acid in your body. Uric acid is a chemical that is made when your body breaks down substances called purines. If your body has too much uric acid, sharp crystals can form and build up in your joints. This causes pain and swelling. Gout attacks can happen quickly and be very painful (acute gout). Over time, the attacks can affect more joints and happen more often (chronic gout). What are the causes? Gout is caused by too much uric acid in your blood. This can happen because: Your kidneys do not remove enough uric acid from your blood. Your body makes too much uric acid. You eat too many foods that are high in purines. These foods include organ meats, some seafood, and beer. Trauma or stress can bring on an attack. What increases the risk? Having a family history of gout. Being female and middle-aged. Being female and having gone through menopause. Having an organ transplant. Taking certain medicines. Having certain conditions, such as: Being very overweight (obese). Lead poisoning. Kidney disease. A skin condition called psoriasis. Other risks include: Losing weight too quickly. Not having enough water in the body (being dehydrated). Drinking alcohol, especially beer. Drinking beverages that are sweetened with a type of sugar called fructose. What are the signs or symptoms? An attack of acute gout often starts at night and usually happens in just one joint. The most common place is the big toe. Other joints that may be affected include joints of the feet, ankle, knee, fingers, wrist, or elbow. Symptoms may include: Very bad pain. Warmth. Swelling. Stiffness. Tenderness. The affected joint may be very painful to touch. Shiny, red, or purple skin. Chills and fever. Chronic gout may cause symptoms more often. More joints may be involved. You may also have white or yellow lumps  (tophi) on your hands or feet or in other areas near your joints. How is this treated? Treatment for an acute attack may include medicines for pain and swelling, such as: NSAIDs, such as ibuprofen. Steroids taken by mouth or injected into a joint. Colchicine. This can be given by mouth or through an IV tube. Treatment to prevent future attacks may include: Taking small doses of NSAIDs or colchicine daily. Using a medicine that reduces uric acid levels in your blood, such as allopurinol. Making changes to your diet. You may need to see a food expert (dietitian) about what to eat and drink to prevent gout. Follow these instructions at home: During a gout attack  If told, put ice on the painful area. To do this: Put ice in a plastic bag. Place a towel between your skin and the bag. Leave the ice on for 20 minutes, 2-3 times a day. Take off the ice if your skin turns bright red. This is very important. If you cannot feel pain, heat, or cold, you have a greater risk of damage to the area. Raise the painful joint above the level of your heart as often as you can. Rest the joint as much as possible. If the joint is in your leg, you may be given crutches. Follow instructions from your doctor about what you cannot eat or drink. Avoiding future gout attacks Eat a low-purine diet. Avoid foods and drinks such as: Liver. Kidney. Anchovies. Asparagus. Herring. Mushrooms. Mussels. Beer. Stay at a healthy weight. If you want to lose weight, talk with your doctor. Do not  lose weight too fast. Start or continue an exercise plan as told by your doctor. Eating and drinking Avoid drinks sweetened by fructose. Drink enough fluids to keep your pee (urine) pale yellow. If you drink alcohol: Limit how much you have to: 0-1 drink a day for women who are not pregnant. 0-2 drinks a day for men. Know how much alcohol is in a drink. In the [place] one drink equals one 12 oz bottle of beer (355 mL), one 5 oz  glass of wine (148 mL), or one 1 oz glass of hard liquor (44 mL). General instructions Take over-the-counter and prescription medicines only as told by your doctor. Ask your doctor if you should avoid driving or using machines while you are taking your medicine. Return to your normal activities when your doctor says that it is safe. Keep all follow-up visits. Where to find more information [employer]: www.niams.http://www.myers.net/ Contact a doctor if: You have another gout attack. You still have symptoms of a gout attack after 10 days of treatment. You have problems (side effects) because of your medicines. You have chills or a fever. You have burning pain when you pee (urinate). You have pain in your lower back or belly. Get help right away if: You have very bad pain. Your pain cannot be controlled. You cannot pee. Summary Gout is painful swelling of the joints. The most common site of pain is the big toe, but it can affect other joints. Medicines and avoiding some foods can help to prevent and treat gout attacks. This information is not intended to replace advice given to you by your health care provider. Make sure you discuss any questions you have with your health care provider. Document Revised: 10/17/2020 Document Reviewed: 10/17/2020 Elsevier Patient Education  2024 [employer].

## 2023-04-01 NOTE — Progress Notes (Signed)
  Subjective:  Patient ID: Morgan Harrell, female    DOB: 08/04/1924,  MRN: [id number]  Morgan Harrell presents to clinic today for at risk foot care with h/o coagulation defect and corn(s) of both feet, callus(es) of both feet and painful mycotic nails.  Pain interferes with ambulation. Aggravating factors include wearing enclosed shoe gear. Painful toenails interfere with ambulation. Aggravating factors include wearing enclosed shoe gear. Pain is relieved with periodic professional debridement. Painful corns and calluses are aggravated when weightbearing with and without shoegear. Pain is relieved with periodic professional debridement. She states she is recovering from gout of the left ankle. She was given colchicine for the initial attack and is now on allopurinol. Chief Complaint  Patient presents with   Nail Problem    Pt is here for [facility] PCP is Dr Darleene Cleaver and LOV was in October.    PCP is Annita Brod, MD.  Allergies  Allergen Reactions   Codeine Nausea And Vomiting    Review of Systems: Negative except as noted in the HPI.  Objective: No changes noted in today's physical examination. There were no vitals filed for this visit. Morgan Harrell is a pleasant 89 y.o. female in NAD. AAO x 3.  Vascular Examination: CFT <3 seconds b/l. DP/PT pulses faintly palpable b/l. Skin temperature gradient warm to warm b/l. No pain with calf compression. No ischemia or gangrene. No cyanosis or clubbing noted b/l. Pedal hair absent.   Neurological Examination: Sensation grossly intact b/l with 10 gram monofilament. Vibratory sensation intact b/l.   Dermatological Examination: Pedal skin warm and supple b/l.   No open wounds. No interdigital macerations.  Toenails 1-5 b/l thick, discolored, elongated with subungual debris and pain on dorsal palpation.    Hyperkeratotic lesion(s) bilateral 5th toes, 3-5 right foot, submet head 1 right foot, submet head 3 right foot, and submet head 5 left foot.   No erythema, no edema, no drainage, no fluctuance.  Musculoskeletal Examination: Muscle strength 5/5 to all lower extremity muscle groups bilaterally. HAV with bunion deformity noted b/l LE. Hammertoe deformity noted 2-5 b/l. Utilizes wheelchair for ambulation assistance.  Radiographs: None  Assessment/Plan: 1. Pain due to onychomycosis of toenails of both feet   2. Corns and callosities   3. Coagulation defect [facility])     -Patient's family member present. All questions/concerns addressed on today's visit. -Mycotic toenails 1-5 bilaterally were debrided in length and girth with sterile nail nippers and dremel without incident. -Corn(s) bilateral 5th toes and 3-5 right foot pared utilizing sterile scalpel blade without complication or incident. Total number debrided=5. -Callus(es) submet head 1 right foot, submet head 3 right foot, and submet head 5 left foot pared utilizing sterile scalpel blade without complication or incident. Total number debrided =3. -Patient/POA to call should there be question/concern in the interim.   Return in about 3 months (around 07/02/2023).  Freddie Breech, DPM      [facility] LOCATION: [address], [place] [address]                   Office [phone]   [facility] LOCATION: [address] [place], [place] [address] Office [phone]

## 2023-04-05 ENCOUNTER — Encounter: Payer: Self-pay | Admitting: Podiatry

## 2023-04-05 DIAGNOSIS — D631 Anemia in chronic kidney disease: Secondary | ICD-10-CM | POA: Insufficient documentation

## 2023-04-09 ENCOUNTER — Encounter (HOSPITAL_BASED_OUTPATIENT_CLINIC_OR_DEPARTMENT_OTHER): Payer: Self-pay | Admitting: Family

## 2023-04-09 ENCOUNTER — Ambulatory Visit (INDEPENDENT_AMBULATORY_CARE_PROVIDER_SITE_OTHER): Payer: Medicare Other | Admitting: Family

## 2023-04-09 VITALS — BP 144/70 | HR 60 | Ht 62.0 in | Wt 186.0 lb

## 2023-04-09 DIAGNOSIS — I5032 Chronic diastolic (congestive) heart failure: Secondary | ICD-10-CM | POA: Diagnosis not present

## 2023-04-09 DIAGNOSIS — D6859 Other primary thrombophilia: Secondary | ICD-10-CM

## 2023-04-09 DIAGNOSIS — N1831 Chronic kidney disease, stage 3a: Secondary | ICD-10-CM

## 2023-04-09 DIAGNOSIS — I10 Essential (primary) hypertension: Secondary | ICD-10-CM

## 2023-04-09 NOTE — Patient Instructions (Addendum)
 Medication Instructions:  Your physician recommends that you continue on your current medications as directed. Please refer to the Current Medication list given to you today.   Follow-Up: 4 months in adv htn clinic with Dr. Duke Salvia, [name], NP or Phillips Hay  Special Instructions:  WE will check in about blood pressure in about two weeks.    Tips to Measure your Blood Pressure Correctly  Check blood pressure at least 2 hours after your medications.   Here's what you can do to ensure a correct reading:  Don't drink a caffeinated beverage or smoke during the 30 minutes before the test.  Sit quietly for five minutes before the test begins.  During the measurement, sit in a chair with your feet on the floor and your arm supported so your elbow is at about heart level.  The inflatable part of the cuff should completely cover at least 80% of your upper arm, and the cuff should be placed on bare skin, not over a shirt.  Don't talk during the measurement.  Have your blood pressure measured twice, with a brief break in between. If the readings are different by 5 points or more, have it done a third time.   Blood pressure categories  Blood pressure category SYSTOLIC (upper number)  DIASTOLIC (lower number)  Normal Less than 120 mm Hg and Less than 80 mm Hg  Elevated 120-129 mm Hg and Less than 80 mm Hg  High blood pressure: Stage 1 hypertension 130-139 mm Hg or 80-89 mm Hg  High blood pressure: Stage 2 hypertension 140 mm Hg or higher or 90 mm Hg or higher  Hypertensive crisis (consult your doctor immediately) Higher than 180 mm Hg and/or Higher than 120 mm Hg  Source: American Heart Association and American Stroke Association. For more on getting your blood pressure under control, buy Controlling Your Blood Pressure, a Special Health Report from [facility].   Blood Pressure Log   Date   Time  Blood Pressure  Example: Nov 1 9 AM 124/78

## 2023-04-09 NOTE — Progress Notes (Signed)
 Cardiology Office Note:  .   Date:  04/09/2023  ID:  [name], DOB [birth year], MRN [id number] PCP: Annita Brod, MD  [facility] Providers Cardiologist:  Chilton Si, MD    History of Present Illness: .   Khrystina Knee is a 89 y.o. female  with a hx of recurrent pulmonary embolism (most recent 01/2018), CKDIII, HTN, HLD, colon cancer 1996 (s/p surgical intervention), bilateral severe knee ostial arthritis, GERD, hypothyroidism, chronic diastolic heart failure.   Seen 01/08/22 and due to elevated creatinine, Losartan and Lasix were reduced. She was seen 03/2022 euvolemic and doing well.   Last seen 10/15/22 by Dr. Duke Salvia noting some LE edema, dyspnea on exertion, fatigue, and labile blood pressure. She was recommended to continue full tablet of Losartan and track BP twice daily. Eliquis reduced to 2.5mg  based on age, kidney function. At visit with rheumatology 10/23/22 BP 130/68. At home visit with PCP 11/21/22 BP 153/80  Last seen 12/04/22 with BP in clinic 132/74 and home cuff found to read inaccurately (20 points high). Her present antihypertensive regimen was continued.  Her daughter plan to purchase new BP cuff.  Since last seen PCP has started allopurinol for gout.  Presents today for follow-up with her daughter. She reports feeling "rough" related to her gout and recent ED visit 03/27/23. Swelling and pain has been gradually improving. Has not been checking her blood pressure recently but did get a new cuff. She reports her mild exertional dyspnea is stable at baseline.  Endorses some fatigue which she attributes to age.   ROS: Please see the history of present illness.    All other systems reviewed and are negative.   Studies Reviewed: .           Risk Assessment/Calculations:     HYPERTENSION CONTROL Vitals:   04/09/23 1133 04/09/23 1157  BP: (!) 163/70 (!) 144/70    The patient's blood pressure is elevated above target today.  In order to address the  patient's elevated BP: Blood pressure will be monitored at home to determine if medication changes need to be made. (Phone call in 2 weeks to check in on BP)          Physical Exam:   VS:  BP (!) 144/70   Pulse 60   Ht 5\' 2"  (1.575 m)   Wt 186 lb (84.4 kg)   BMI 34.02 kg/m    Wt Readings from Last 3 Encounters:  04/09/23 186 lb (84.4 kg)  04/01/23 184 lb 15.5 oz (83.9 kg)  03/27/23 184 lb 15.5 oz (83.9 kg)    GEN: Well nourished, well developed in no acute distress NECK: No JVD; No carotid bruits CARDIAC: RRR, no murmurs, rubs, gallops RESPIRATORY:  Clear to auscultation without rales, wheezing or rhonchi  ABDOMEN: Soft, non-tender, non-distended EXTREMITIES:  No edema; No deformity   ASSESSMENT AND PLAN: .    HFpEF - Euvolemic and well compensated on exam.  Continue Farxiga 5 mg daily, losartan 50 mg daily, metoprolol tartrate 25 mg twice daily, Lasix 20 mg daily.  Low sodium diet, fluid restriction <2L, and daily weights encouraged. Educated to contact our office for weight gain of 2 lbs overnight or 5 lbs in one week.    CKD III - Careful titration of diuretic and antihypertensive.     HTN -given age BP goal less than 140/90.  Initial BP clinic 163/70 and repeat without intervention 144/70.   Has purchased new home BP cuff but not yet started checking.  Phone call in 2 weeks to check in on blood pressure.  If blood pressure not routinely at goal less than 140/90 plan to transition losartan to olmesartan.  In the interim, continue present  antihypertensive regimen Lasix 20 mg daily, losartan 50 mg daily, metoprolol titrate 25 mg twice daily.    Prior PE on chronic anticoagulation - Reduced dose OAC due to age, renal function.  Denies bleeding, complications.03/27/23 Hb 11.3.    Hypothyroidism / Gout - Continue to follow with PCP.        Dispo: follow up in 4months  Signed, Alver Sorrow, NP

## 2023-04-23 ENCOUNTER — Telehealth (HOSPITAL_BASED_OUTPATIENT_CLINIC_OR_DEPARTMENT_OTHER): Payer: Self-pay

## 2023-04-23 NOTE — Telephone Encounter (Signed)
 Returned call to patient and daughter, provider recommendations reviewed.

## 2023-04-23 NOTE — Telephone Encounter (Signed)
 Called to check on BP since visit, daughter states their nurse will be there today to check her BP. She states her BP's have been running in the 130s/80 consistently but she didn't have all the exact recordings with her in that moment. Advised would let provider know and call with any changes to be made.

## 2023-04-23 NOTE — Telephone Encounter (Addendum)
 Call 3/27   ----- Message from Nurse Lendon Collar sent at 04/09/2023 12:03 PM EDT ----- Bp checkin

## 2023-04-23 NOTE — Telephone Encounter (Signed)
 BP at goal <140/90, continue present regimen.   Alver Sorrow, NP

## 2023-04-24 NOTE — Telephone Encounter (Signed)
 Agree with PCP evaluation. Would not expect those BP readings to contribute to fatigue.   Alver Sorrow, NP

## 2023-04-24 NOTE — Telephone Encounter (Signed)
 Daughter called back to say the recommendation that was giving is not working. Patient is still tired and very fatigue. Was calling to get her to be seen asap. Please advise

## 2023-04-24 NOTE — Telephone Encounter (Addendum)
 Spoke with daughter and advised to reach out to PCP, verbalized understanding

## 2023-04-24 NOTE — Telephone Encounter (Signed)
 Spoke with daughter regarding how mother is feeling Blood pressure yesterday was 130's/80's Has some swelling, per daughter goes up and down Nurse came out to see patient yesterday  Has not had follow up with PCP since d/c from hospital Per daughter patient is tired all the time, doesn't feel well, and has just not been the same since in hospital with gout.  When asked about chest pains, daughter stated none that she knows of  Will forward to Ronn Melena NP for review

## 2023-05-08 ENCOUNTER — Ambulatory Visit (HOSPITAL_BASED_OUTPATIENT_CLINIC_OR_DEPARTMENT_OTHER): Admitting: Family

## 2023-07-08 ENCOUNTER — Ambulatory Visit (INDEPENDENT_AMBULATORY_CARE_PROVIDER_SITE_OTHER): Admitting: Podiatry

## 2023-07-08 ENCOUNTER — Encounter: Payer: Self-pay | Admitting: Podiatry

## 2023-07-08 DIAGNOSIS — M79675 Pain in left toe(s): Secondary | ICD-10-CM

## 2023-07-08 DIAGNOSIS — D689 Coagulation defect, unspecified: Secondary | ICD-10-CM | POA: Diagnosis not present

## 2023-07-08 DIAGNOSIS — L84 Corns and callosities: Secondary | ICD-10-CM | POA: Diagnosis not present

## 2023-07-08 DIAGNOSIS — M79674 Pain in right toe(s): Secondary | ICD-10-CM | POA: Diagnosis not present

## 2023-07-08 DIAGNOSIS — B351 Tinea unguium: Secondary | ICD-10-CM | POA: Diagnosis not present

## 2023-07-14 NOTE — Progress Notes (Signed)
  Subjective:  Patient ID: Morgan Harrell, female    DOB: 1924/10/04,  MRN: [id number]  Morgan Harrell presents to clinic today for at risk foot care with h/o coagulation defect and callus(es) b/l feet and painful mycotic toenails that are difficult to trim. Painful toenails interfere with ambulation. Aggravating factors include wearing enclosed shoe gear. Pain is relieved with periodic professional debridement. Painful calluses are aggravated when weightbearing with and without shoegear. Pain is relieved with periodic professional debridement.  Chief Complaint  Patient presents with   RFC    Rm15 RFC not diabetic/last pcp visit may 2025   New problem(s): None.   PCP is Financial risk analyst, [facility].  Allergies  Allergen Reactions   Codeine Nausea And Vomiting    Review of Systems: Negative except as noted in the HPI.  Objective: No changes noted in today's physical examination. There were no vitals filed for this visit. Morgan Harrell is a pleasant 89 y.o. female in NAD. AAO x 3.  Vascular Examination: CFT <3 seconds b/l. DP/PT pulses faintly palpable b/l. Skin temperature gradient warm to warm b/l. No pain with calf compression. No ischemia or gangrene. No cyanosis or clubbing noted b/l. Pedal hair absent. Trace edema noted BLE.   Neurological Examination: Sensation grossly intact b/l with 10 gram monofilament. Vibratory sensation intact b/l.   Dermatological Examination: Pedal skin warm and supple b/l.   No open wounds. No interdigital macerations.  Toenails 1-5 b/l thick, discolored, elongated with subungual debris and pain on dorsal palpation.    Hyperkeratotic lesion(s) dorsal PIPJ of bilateral 5th toes, submet head 1 right foot, submet head 3 right foot, and submet head 5 left foot.  No erythema, no edema, no drainage, no fluctuance.  Musculoskeletal Examination: Muscle strength 5/5 to all lower extremity muscle groups bilaterally. HAV with bunion bilaterally and hammertoes  2-5 b/l.  Radiographs: None  Assessment/Plan: 1. Pain due to onychomycosis of toenails of both feet   2. Corns and callosities   3. Coagulation defect [facility])     Consent given for treatment. Patient examined. All patient's and/or POA's questions/concerns addressed on today's visit. Toenails 1-5 debrided in length and girth without incident. Corn(s) and callus(es) dorsal PIPJ of bilateral 5th toes, submet head 1 right foot, submet head 3 right foot, and submet head 5 left foot pared with sharp debridement without incident. Continue soft, supportive shoe gear daily. Report any pedal injuries to medical professional. Call office if there are any questions/concerns. -Patient/POA to call should there be question/concern in the interim.   Return in about 3 months (around 10/08/2023).  Morgan Harrell, DPM      [place] LOCATION: [address], [place] [address]                   Office [phone]   [facility] LOCATION: [address] [place], [place] [address] Office [phone]

## 2023-08-19 ENCOUNTER — Ambulatory Visit (HOSPITAL_BASED_OUTPATIENT_CLINIC_OR_DEPARTMENT_OTHER): Admitting: Cardiovascular Disease

## 2023-10-28 ENCOUNTER — Encounter: Payer: Self-pay | Admitting: Podiatry

## 2023-10-28 ENCOUNTER — Ambulatory Visit: Admitting: Podiatry

## 2023-10-28 DIAGNOSIS — B351 Tinea unguium: Secondary | ICD-10-CM | POA: Diagnosis not present

## 2023-10-28 DIAGNOSIS — M79674 Pain in right toe(s): Secondary | ICD-10-CM | POA: Diagnosis not present

## 2023-10-28 DIAGNOSIS — M79675 Pain in left toe(s): Secondary | ICD-10-CM | POA: Diagnosis not present

## 2023-10-28 DIAGNOSIS — D689 Coagulation defect, unspecified: Secondary | ICD-10-CM | POA: Diagnosis not present

## 2023-10-28 DIAGNOSIS — L84 Corns and callosities: Secondary | ICD-10-CM

## 2023-10-30 NOTE — Progress Notes (Signed)
  Subjective:  Patient ID: Morgan Harrell, female    DOB: 08-04-1924,  MRN: [id number]  Morgan Harrell presents to clinic today for at risk foot care with h/o coagulation defect and corn(s) of both feet, callus(es) of both feet and painful mycotic nails.  Pain interferes with ambulation. Aggravating factors include wearing enclosed shoe gear. Painful toenails interfere with ambulation. Aggravating factors include wearing enclosed shoe gear. Pain is relieved with periodic professional debridement. Painful corns and calluses are aggravated when weightbearing with and without shoegear. Pain is relieved with periodic professional debridement. Her daughter is present during today's visit. Chief Complaint  Patient presents with   RFC     RFC Non diabetic toenail trim and callus care. LOV with PCP 08/25/23.   New problem(s): None.   PCP is Karlene Mungo, MD.  Allergies  Allergen Reactions   Codeine Nausea And Vomiting    Review of Systems: Negative except as noted in the HPI.  Objective:  There were no vitals filed for this visit. Morgan Harrell is a pleasant 89 y.o. female WD, WN in NAD. AAO x 3.  Vascular Examination: CFT <3 seconds b/l. DP/PT pulses faintly palpable b/l. Skin temperature gradient warm to warm b/l. No pain with calf compression. No ischemia or gangrene. No cyanosis or clubbing noted b/l. Pedal hair absent. Trace edema noted BLE. Evidence of skin changes consistent with long term venous stasis BLE.  Neurological Examination: Sensation grossly intact b/l with 10 gram monofilament.  Dermatological Examination: Pedal skin warm and supple b/l.   No open wounds. No interdigital macerations.  Toenails 1-5 b/l thick, discolored, elongated with subungual debris and pain on dorsal palpation.    Hyperkeratotic lesion(s) dorsal PIPJ of bilateral 5th toes, submet head 1 right foot, submet head 3 right foot, and submet head 5 left foot.  No erythema, no edema, no drainage, no  fluctuance.  Musculoskeletal Examination: Muscle strength 5/5 to all lower extremity muscle groups bilaterally. HAV with bunion bilaterally and hammertoes 2-5 b/l.  Radiographs: None  Assessment/Plan: 1. Pain due to onychomycosis of toenails of both feet   2. Corns and callosities   3. Coagulation defect   Patient was evaluated and treated. All patient's and/or POA's questions/concerns addressed on today's visit. Mycotic toenails 1-5 debrided in length and girth without incident. Corn(s)/Callus(es) dorsal PIPJ of bilateral 5th toes, submet head 1 right foot, submet head 3 right foot, and submet head 5 left foot pared with sharp debridement without incident. Continue soft, supportive shoe gear daily. Report any pedal injuries to medical professional. Call office if there are any questions/concerns.  Return in about 3 months (around 01/28/2024).  Delon LITTIE Merlin, DPM      [place] LOCATION: [address], [place] [address]                   Office [phone]   [facility] LOCATION: [address] [place], [place] [address] Office [phone]

## 2023-11-02 ENCOUNTER — Other Ambulatory Visit (HOSPITAL_BASED_OUTPATIENT_CLINIC_OR_DEPARTMENT_OTHER): Payer: Self-pay | Admitting: Cardiovascular Disease

## 2023-11-24 ENCOUNTER — Encounter (HOSPITAL_BASED_OUTPATIENT_CLINIC_OR_DEPARTMENT_OTHER): Payer: Self-pay | Admitting: Cardiovascular Disease

## 2023-11-24 ENCOUNTER — Ambulatory Visit (INDEPENDENT_AMBULATORY_CARE_PROVIDER_SITE_OTHER): Admitting: Cardiovascular Disease

## 2023-11-24 VITALS — BP 140/64 | HR 51 | Ht 65.5 in | Wt 176.4 lb

## 2023-11-24 DIAGNOSIS — E782 Mixed hyperlipidemia: Secondary | ICD-10-CM

## 2023-11-24 DIAGNOSIS — I5189 Other ill-defined heart diseases: Secondary | ICD-10-CM

## 2023-11-24 DIAGNOSIS — I2693 Single subsegmental pulmonary embolism without acute cor pulmonale: Secondary | ICD-10-CM | POA: Diagnosis not present

## 2023-11-24 DIAGNOSIS — I10 Essential (primary) hypertension: Secondary | ICD-10-CM | POA: Diagnosis not present

## 2023-11-24 DIAGNOSIS — G4733 Obstructive sleep apnea (adult) (pediatric): Secondary | ICD-10-CM

## 2023-11-24 DIAGNOSIS — N184 Chronic kidney disease, stage 4 (severe): Secondary | ICD-10-CM

## 2023-11-24 MED ORDER — FARXIGA 5 MG PO TABS: 5.0000 mg | ORAL_TABLET | Freq: Every day | ORAL | 3 refills | Status: AC

## 2023-11-24 MED ORDER — METOPROLOL TARTRATE 25 MG PO TABS: 25.0000 mg | ORAL_TABLET | Freq: Two times a day (BID) | ORAL | 3 refills | Status: AC

## 2023-11-24 MED ORDER — ATORVASTATIN CALCIUM 20 MG PO TABS: 20.0000 mg | ORAL_TABLET | Freq: Every day | ORAL | 3 refills | Status: DC

## 2023-11-24 MED ORDER — APIXABAN 2.5 MG PO TABS: 2.5000 mg | ORAL_TABLET | Freq: Two times a day (BID) | ORAL | 1 refills | Status: AC

## 2023-11-24 MED ORDER — FUROSEMIDE 20 MG PO TABS: 20.0000 mg | ORAL_TABLET | Freq: Two times a day (BID) | ORAL | 3 refills | Status: AC

## 2023-11-24 MED ORDER — LOSARTAN POTASSIUM 50 MG PO TABS: 50.0000 mg | ORAL_TABLET | Freq: Every day | ORAL | 3 refills | Status: AC

## 2023-11-24 NOTE — Patient Instructions (Signed)
 Medication Instructions:  Your physician recommends that you continue on your current medications as directed. Please refer to the Current Medication list given to you today.  *If you need a refill on your cardiac medications before your next appointment, please call your pharmacy*  Lab Work: FASTING LP/CMET IN 1 MONTH  If you have labs (blood work) drawn today and your tests are completely normal, you will receive your results only by: MyChart Message (if you have MyChart) OR A paper copy in the mail If you have any lab test that is abnormal or we need to change your treatment, we will call you to review the results.  Testing/Procedures: NONE  Follow-Up: At [facility], you and your health needs are our priority.  As part of our continuing mission to provide you with exceptional heart care, our providers are all part of one team.  This team includes your primary Cardiologist (physician) and Advanced Practice Providers or APPs (Physician Assistants and Nurse Practitioners) who all work together to provide you with the care you need, when you need it.  Your next appointment:   6 month(s)  Provider:   Annabella Scarce, MD, Rosaline Bane, NP, or Reche Finder, NP    We recommend signing up for the patient portal called MyChart.  Sign up information is provided on this After Visit Summary.  MyChart is used to connect with patients for Virtual Visits (Telemedicine).  Patients are able to view lab/test results, encounter notes, upcoming appointments, etc.  Non-urgent messages can be sent to your provider as well.   To learn more about what you can do with MyChart, go to [email or web address].

## 2023-11-24 NOTE — Progress Notes (Signed)
 Cardiology Office Note:  .   Date:  11/24/2023  ID:  Morgan Harrell, DOB 1924/07/21, MRN [id number] PCP: Karlene Mungo, MD  [facility] Providers Cardiologist:  Annabella Scarce, MD    History of Present Illness: .    Morgan Harrell is a 89 y.o. female with recurrent pulmonary embolism on chronic Eliquis , HFpEF, CKD 3, hypertension, hyperlipidemia, gout, colon cancer status post surgery, severe arthritis, GERD, and hypothyroidism here for follow-up.  She was previously a patient of Dr. Claudene.  Her last echo was 01/2020 and LVEF was 60-65% with indeterminate diastolic function.  She has had worsening renal function and losartan  and furosemide  were both reduced.  At her visit 09/2022 she was doing well. BP was labile but at follow up with Reche Finder, NP her home cuff was found to be inaccurate.  Eliquis  dose was reduced.  She has struggled with gout.  Discussed the use of AI scribe software for clinical note transcription with the patient, who gave verbal consent to proceed.  History of Present Illness [name] experiences occasional shortness of breath, particularly with exertion, but notes improvement compared to last year. No significant changes in her breathing have been observed recently.  Her blood pressure has been variable at home, and she has not checked it recently due to a busy schedule. She is currently taking losartan  and metoprolol for blood pressure management.  She is currently taking Farxiga , Lasix , Eliquis , and atorvastatin as part of her medication regimen. Her cholesterol was last checked in August, with an LDL level of 106. Her atorvastatin dose was increased from 10 mg to 20 mg around that time.  She reports arthritis in her hands and knees, causing significant discomfort and limiting mobility. She uses a stair lift at home to assist with mobility.  She has a history of gout affecting her legs and is currently on allopurinol for management.  No recent  swelling in her legs. No significant changes in her breathing compared to last year.  ROS:  As per HPI  Studies Reviewed: SABRA   EKG Interpretation Date/Time:  Tuesday November 24 2023 09:30:15 EDT Ventricular Rate:  51 PR Interval:  106 QRS Duration:  114 QT Interval:  484 QTC Calculation: 446 R Axis:   -30  Text Interpretation: Sinus bradycardia with short PR with Premature atrial complexes Left axis deviation Incomplete right bundle branch block T wave abnormality, consider inferior ischemia When compared with ECG of 15-Oct-2022 15:23, Premature atrial complexes are now Present PR interval has decreased Confirmed by Scarce Annabella ([id number]) on 11/24/2023 9:35:56 AM   Echo 01/2020:  1. Left ventricular ejection fraction, by estimation, is 60 to 65%. The  left ventricle has normal function. The left ventricle has no regional  wall motion abnormalities. Left ventricular diastolic parameters are  indeterminate. Elevated left ventricular  end-diastolic pressure.   2. Right ventricular systolic function is normal. The right ventricular  size is normal. There is normal pulmonary artery systolic pressure.   3. Right atrial size was mildly dilated.   4. The mitral valve is normal in structure. Trivial mitral valve  regurgitation. No evidence of mitral stenosis.   5. The aortic valve is tricuspid. There is mild calcification of the  aortic valve. There is mild thickening of the aortic valve. Aortic valve  regurgitation is not visualized. No aortic stenosis is present.   6. The inferior vena cava is normal in size with greater than 50%  respiratory variability, suggesting right atrial pressure of 3 mmHg.  Risk Assessment/Calculations:     HYPERTENSION CONTROL Vitals:   11/24/23 0927 11/24/23 0945  BP: (!) 152/64 (!) 140/64    The patient's blood pressure is elevated above target today.  In order to address the patient's elevated BP: Blood pressure will be monitored at home to  determine if medication changes need to be made.          Physical Exam:   VS:  BP (!) 140/64   Pulse (!) 51   Ht 5' 5.5 (1.664 m)   Wt 176 lb 6.4 oz (80 kg)   SpO2 93%   BMI 28.91 kg/m  , BMI Body mass index is 28.91 kg/m. GENERAL:  Well appearing HEENT: Pupils equal round and reactive, fundi not visualized, oral mucosa unremarkable NECK:  No jugular venous distention, waveform within normal limits, carotid upstroke brisk and symmetric, no bruits, no thyromegaly LUNGS:  Clear to auscultation bilaterally HEART:  RRR.  PMI not displaced or sustained,S1 and S2 within normal limits, no S3, no S4, no clicks, no rubs, no murmurs ABD:  Flat, positive bowel sounds normal in frequency in pitch, no bruits, no rebound, no guarding, no midline pulsatile mass, no hepatomegaly, no splenomegaly EXT:  2 plus pulses throughout, no edema, no cyanosis no clubbing SKIN:  No rashes no nodules NEURO:  Cranial nerves II through XII grossly intact, motor grossly intact throughout PSYCH:  Cognitively intact, oriented to person place and time   ASSESSMENT AND PLAN: .    Assessment & Plan # HFpEF:  She is euvolemic.  - Continue Farxiga  for heart failure management. - Continue Lasix  for fluid management.  # Primary hypertension Blood pressure is variable at home and was elevated during the visit but improved upon recheck. - Continue losartan  and metoprolol for blood pressure management. - Monitor blood pressure at home regularly.  # Mixed hyperlipidemia LDL cholesterol was slightly elevated at 106 mg/dL in August. Atorvastatin dose was increased to assess response. - Recheck cholesterol levels in one month with fasting labs. - Continue atorvastatin 20 mg daily.  # Gout Recent gout flare was challenging. Currently managed with allopurinol. - Continue allopurinol for gout management.        Dispo: f/u 6 months  Signed, Annabella Scarce, MD

## 2023-12-29 ENCOUNTER — Telehealth (HOSPITAL_BASED_OUTPATIENT_CLINIC_OR_DEPARTMENT_OTHER): Payer: Self-pay | Admitting: Cardiovascular Disease

## 2023-12-29 LAB — COMPREHENSIVE METABOLIC PANEL WITH GFR
ALT: 12 IU/L (ref 0–32)
AST: 19 IU/L (ref 0–40)
Albumin: 4.2 g/dL (ref 3.6–4.6)
Alkaline Phosphatase: 64 IU/L (ref 48–129)
BUN/Creatinine Ratio: 28 (ref 12–28)
BUN: 55 mg/dL — ABNORMAL HIGH (ref 10–36)
Bilirubin Total: 0.3 mg/dL (ref 0.0–1.2)
CO2: 23 mmol/L (ref 20–29)
Calcium: 10.2 mg/dL (ref 8.7–10.3)
Chloride: 103 mmol/L (ref 96–106)
Creatinine, Ser: 1.94 mg/dL — ABNORMAL HIGH (ref 0.57–1.00)
Globulin, Total: 3 g/dL (ref 1.5–4.5)
Glucose: 89 mg/dL (ref 70–99)
Potassium: 4.6 mmol/L (ref 3.5–5.2)
Sodium: 143 mmol/L (ref 134–144)
Total Protein: 7.2 g/dL (ref 6.0–8.5)
eGFR: 23 mL/min/1.73 — ABNORMAL LOW (ref >59)

## 2023-12-29 LAB — LIPID PANEL
Chol/HDL Ratio: 4.5 ratio — ABNORMAL HIGH (ref 0.0–4.4)
Cholesterol, Total: 187 mg/dL (ref 100–199)
HDL: 42 mg/dL (ref >39)
LDL Chol Calc (NIH): 113 mg/dL — ABNORMAL HIGH (ref 0–99)
Triglycerides: 185 mg/dL — ABNORMAL HIGH (ref 0–149)
VLDL Cholesterol Cal: 32 mg/dL (ref 5–40)

## 2023-12-29 NOTE — Telephone Encounter (Signed)
 Patient dropped off paperwork for disability placard renewal at the [employer] front desk. Placed in Dr Skeeter box for review.

## 2024-01-01 ENCOUNTER — Ambulatory Visit (HOSPITAL_BASED_OUTPATIENT_CLINIC_OR_DEPARTMENT_OTHER): Payer: Self-pay | Admitting: Family

## 2024-01-01 DIAGNOSIS — E782 Mixed hyperlipidemia: Secondary | ICD-10-CM

## 2024-01-01 DIAGNOSIS — Z5181 Encounter for therapeutic drug level monitoring: Secondary | ICD-10-CM

## 2024-01-01 DIAGNOSIS — I10 Essential (primary) hypertension: Secondary | ICD-10-CM

## 2024-01-04 NOTE — Telephone Encounter (Signed)
 Completed and advised daughter Jamee, ok per [facility]

## 2024-01-25 ENCOUNTER — Encounter (HOSPITAL_BASED_OUTPATIENT_CLINIC_OR_DEPARTMENT_OTHER): Payer: Self-pay | Admitting: *Deleted

## 2024-01-25 MED ORDER — ATORVASTATIN CALCIUM 40 MG PO TABS: 40.0000 mg | ORAL_TABLET | Freq: Every day | ORAL | 3 refills | Status: AC

## 2024-02-16 ENCOUNTER — Ambulatory Visit: Admitting: Podiatry

## 2024-02-16 ENCOUNTER — Encounter: Payer: Self-pay | Admitting: Podiatry

## 2024-02-16 DIAGNOSIS — D689 Coagulation defect, unspecified: Secondary | ICD-10-CM

## 2024-02-16 DIAGNOSIS — B351 Tinea unguium: Secondary | ICD-10-CM

## 2024-02-16 DIAGNOSIS — L84 Corns and callosities: Secondary | ICD-10-CM

## 2024-02-16 DIAGNOSIS — M79674 Pain in right toe(s): Secondary | ICD-10-CM

## 2024-02-16 DIAGNOSIS — M79675 Pain in left toe(s): Secondary | ICD-10-CM

## 2024-02-21 ENCOUNTER — Encounter: Payer: Self-pay | Admitting: Podiatry

## 2024-02-21 NOTE — Progress Notes (Signed)
 "  Subjective:  Patient ID: Morgan Harrell, female    DOB: 1924-11-07,  MRN: [id number]  Morgan Harrell presents to clinic today for at risk foot care with h/o coagulation defect and corn(s) b/l feet, callus(es) b/l feet and painful mycotic nails.  Pain interferes with ambulation. Aggravating factors include wearing enclosed shoe gear. Painful toenails interfere with ambulation. Aggravating factors include wearing enclosed shoe gear. Pain is relieved with periodic professional debridement. Painful corns and calluses are aggravated when weightbearing with and without shoegear. Pain is relieved with periodic professional debridement. She is accompanied by her daughter on today's visit. Patient states her left 4th toe is painful on today's visit. Daughter states Mom wore a pair of expensive leather house slippers.  Chief Complaint  Patient presents with   RFC     RFC Toenail trim. LOV with PCP 11/24/23.   New problem(s): None.   PCP is Karlene Mungo, MD.  Allergies[1]  Review of Systems: Negative except as noted in the HPI.  Objective:  There were no vitals filed for this visit. Morgan Harrell is a pleasant 89 y.o. female WD, WN in NAD. AAO x 3.  Vascular Examination: CFT <3 seconds b/l. DP/PT pulses faintly palpable b/l. Skin temperature gradient warm to warm b/l. No pain with calf compression. No ischemia or gangrene. No cyanosis or clubbing noted b/l. Pedal hair absent. Trace edema noted BLE. Evidence of skin changes consistent with long term venous stasis BLE.  Neurological Examination: Sensation grossly intact b/l with 10 gram monofilament.  Dermatological Examination: Pedal skin warm and supple b/l.   No open wounds. No interdigital macerations.  Toenails 1-5 b/l thick, discolored, elongated with subungual debris and pain on dorsal palpation.    Hyperkeratotic lesion(s) dorsal PIPJ of bilateral 5th toes and PIPJ left 4th toe, dorsal DIPJ left 4th toe, submet head 1 right foot,  submet head 3 right foot, and submet head 5 left foot.  No erythema, no edema, no drainage, no fluctuance.  Musculoskeletal Examination: Muscle strength 5/5 to all lower extremity muscle groups bilaterally. HAV with bunion bilaterally and hammertoes 2-5 b/l.  Radiographs: None  Assessment/Plan: 1. Pain due to onychomycosis of toenails of both feet   2. Corns and callosities   3. Coagulation defect   Consent given for treatment. Patient examined. All patient's and/or POA's questions/concerns addressed on today's visit. Toenails 1-5 b/l debrided in length and girth without incident. Corn(s) and callus(es) dorsal DIPJ of left fourth digit, dorsal PIPJ left 4th toe, dorsal PIPJ bilateral 5th digits, submet head 1 right foot, submet head 3 right foot, and submet head 5 left foot pared with sharp debridement without incident. Apply Neosporin Cream to left 4th toe once daily for one week. Recommended [employer] knit shoes with stretchable uppers. Daughter given visual examples. Continue soft, supportive shoe gear daily. Report any pedal injuries to medical professional. Call office if there are any questions/concerns.  Return in about 3 months (around 05/16/2024).  Delon LITTIE Merlin, DPM      [facility] LOCATION: [address], [place] [address]                   Office [phone]   [place] LOCATION: [address]  [place], [place] [address] Office [phone]      [1]  Allergies Allergen Reactions   Codeine Nausea And Vomiting   "

## 2024-05-24 ENCOUNTER — Ambulatory Visit: Admitting: Podiatry
